# Patient Record
Sex: Female | Born: 1935 | Race: White | Hispanic: No
Health system: Southern US, Community
[De-identification: ages and names within clinical notes are randomized; demographics above are authoritative.]

---

## 2005-12-29 ENCOUNTER — Other Ambulatory Visit: Payer: Self-pay

## 2005-12-29 ENCOUNTER — Inpatient Hospital Stay: Payer: Self-pay

## 2006-03-22 ENCOUNTER — Emergency Department: Payer: Self-pay | Admitting: Unknown Physician Specialty

## 2006-03-22 ENCOUNTER — Other Ambulatory Visit: Payer: Self-pay

## 2006-04-26 ENCOUNTER — Other Ambulatory Visit: Payer: Self-pay

## 2006-04-26 ENCOUNTER — Inpatient Hospital Stay: Payer: Self-pay | Admitting: Internal Medicine

## 2006-05-08 ENCOUNTER — Emergency Department: Payer: Self-pay | Admitting: Emergency Medicine

## 2006-05-08 ENCOUNTER — Other Ambulatory Visit: Payer: Self-pay

## 2006-08-06 IMAGING — CR DG ABDOMEN 3V
1 series · 4 of 4 positions shown · non-contrast
Comparison: none

REASON FOR EXAM: abdominal pain
COMMENTS:

[Series 1: view not recorded · 0.17mm/px · 4 of 4 slices shown]
[im 1/4]
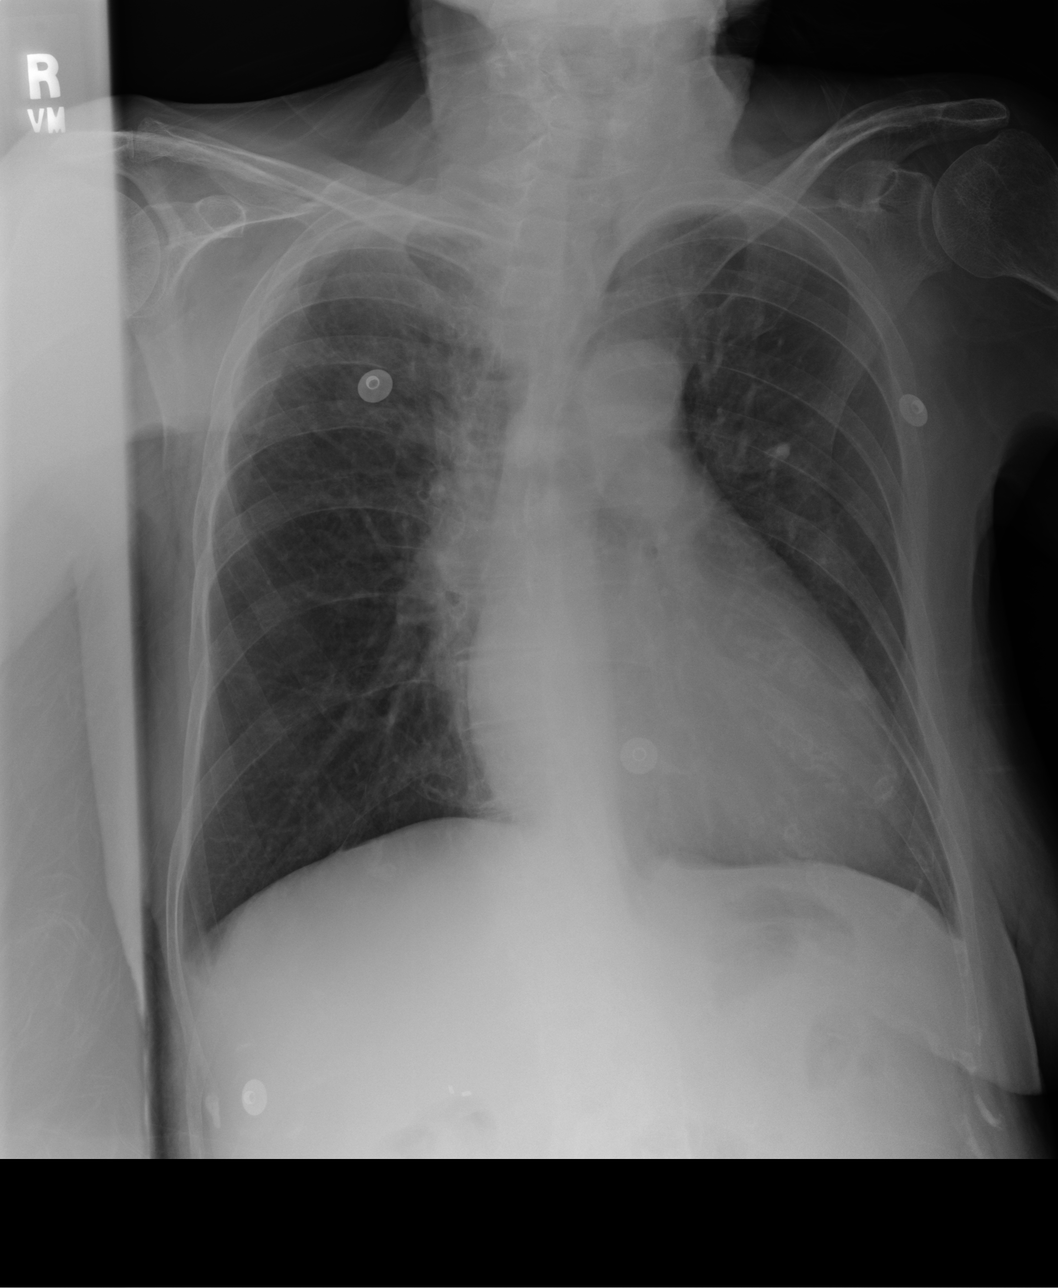
[im 2/4]
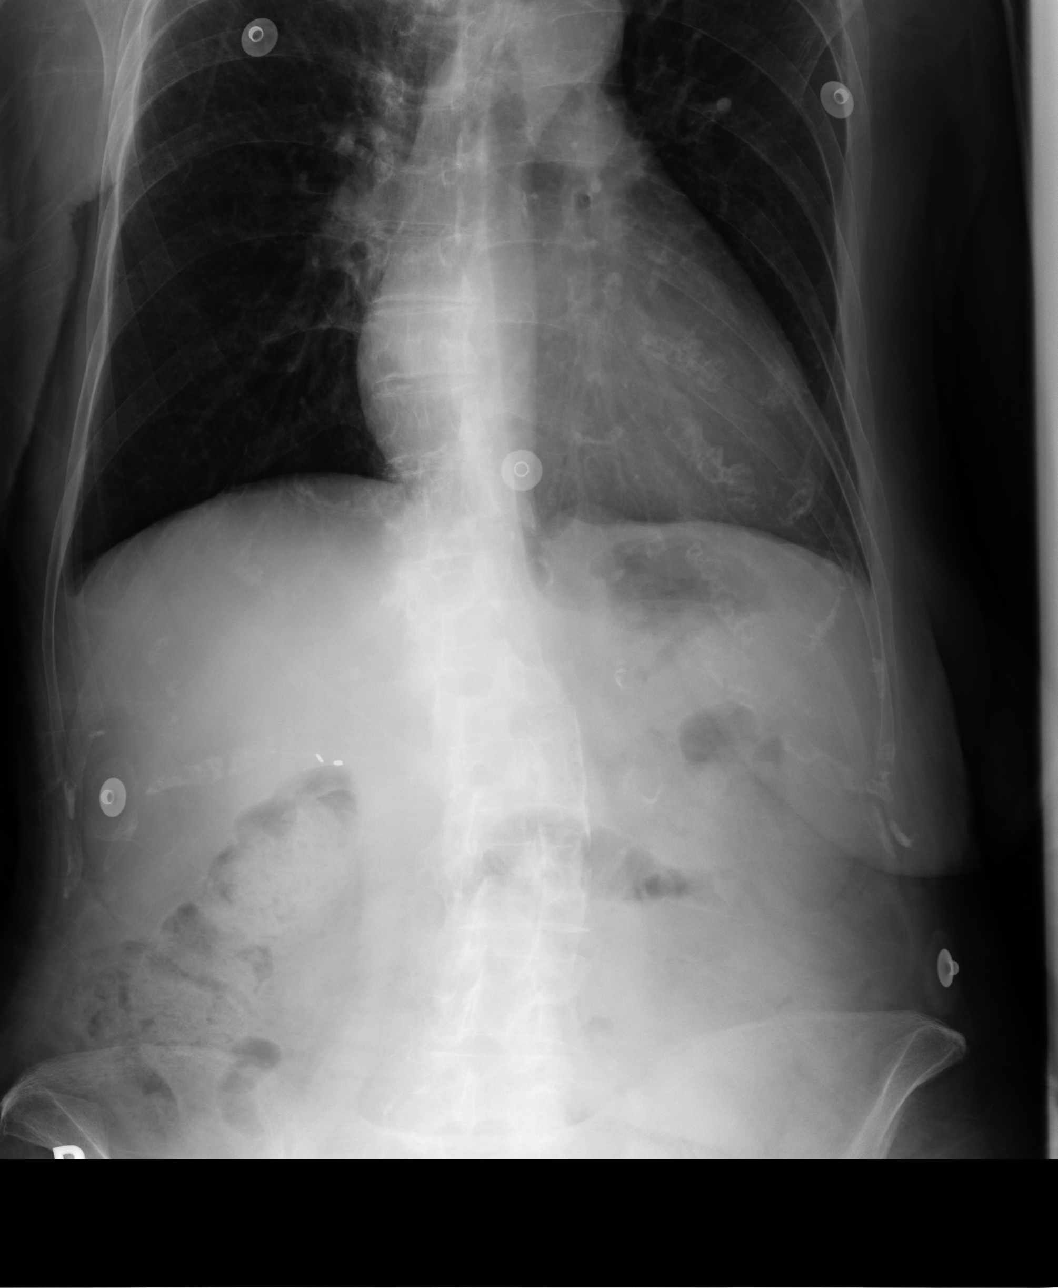
[im 3/4]
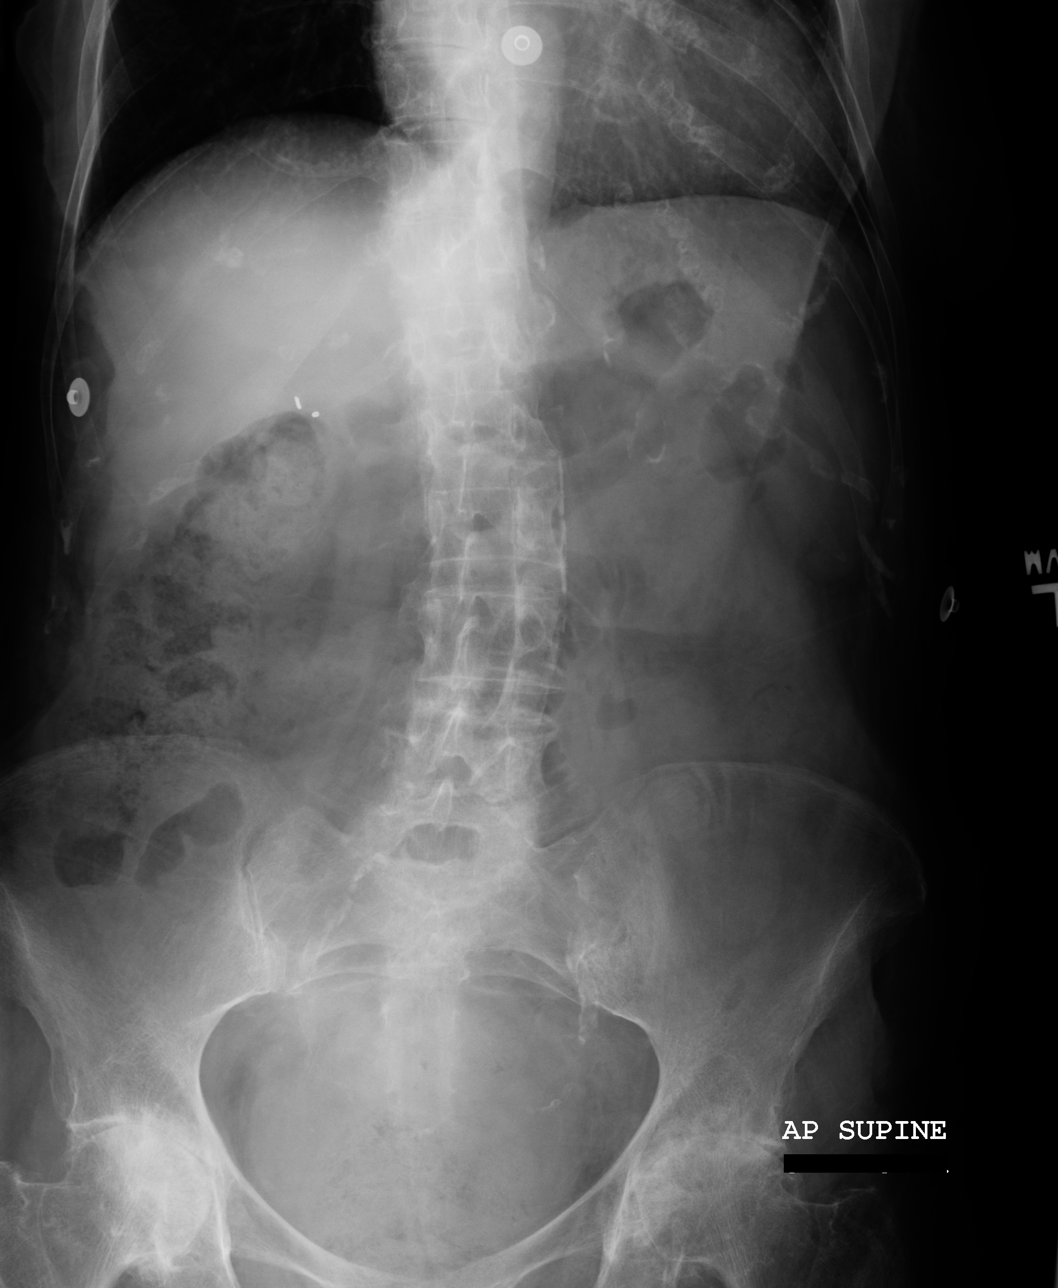
[im 4/4]
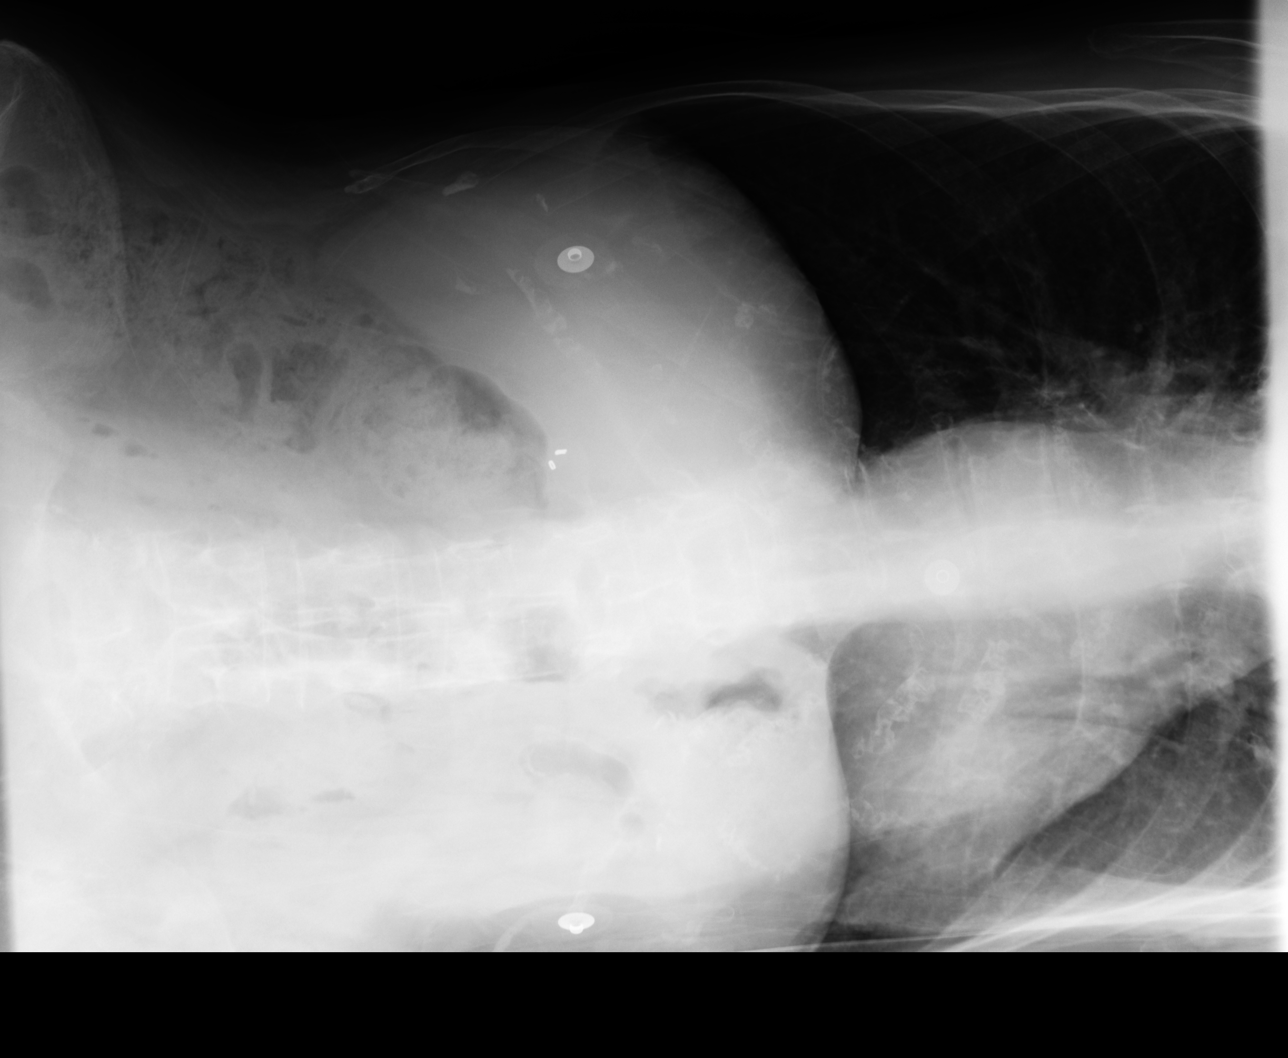

[4 of 4 positions shown; findings below may reference images not displayed]

PROCEDURE:     DXR - DXR ABDOMEN 3-WAY (INCL PA CXR)  - May 09, 2006 [DATE]

RESULT:          Cardiomegaly is present.  There is no evidence of
congestive heart failure on the chest x-ray.  Surgical clips are noted in
the RIGHT upper quadrant.  The gas pattern is nonspecific.  There is an
air-filled loop of small bowel and a normal colonic gas pattern with stool
in the colon.  Aortoiliac atherosclerotic vascular disease is noted.  Severe
degenerative change is noted of both hips.  There is no free air.
IMPRESSION: 1.     Cardiomegaly.  No congestive heart failure.
2.     Aortoiliac atherosclerotic vascular disease and thoracolumbar
degenerative disease as well as bilateral hip degenerative disease.
3.     The exam is otherwise unremarkable.  The gas pattern is nonspecific.
There is no free air.

## 2006-12-23 ENCOUNTER — Ambulatory Visit: Payer: Self-pay | Admitting: Internal Medicine

## 2008-03-21 ENCOUNTER — Other Ambulatory Visit: Payer: Self-pay

## 2008-03-21 ENCOUNTER — Ambulatory Visit: Payer: Self-pay | Admitting: Otolaryngology

## 2008-03-28 ENCOUNTER — Ambulatory Visit: Payer: Self-pay | Admitting: Otolaryngology

## 2009-01-27 ENCOUNTER — Emergency Department: Payer: Self-pay | Admitting: Internal Medicine

## 2009-06-27 ENCOUNTER — Emergency Department: Payer: Self-pay | Admitting: Emergency Medicine

## 2009-10-26 ENCOUNTER — Inpatient Hospital Stay: Payer: Self-pay | Admitting: Internal Medicine

## 2010-01-24 ENCOUNTER — Ambulatory Visit: Payer: Self-pay | Admitting: Family Medicine

## 2012-09-22 ENCOUNTER — Emergency Department: Payer: Self-pay | Admitting: Emergency Medicine

## 2014-05-23 ENCOUNTER — Emergency Department: Payer: Self-pay | Admitting: Emergency Medicine

## 2014-05-23 LAB — URINALYSIS, COMPLETE
BACTERIA: NONE SEEN
Bilirubin,UR: NEGATIVE
Blood: NEGATIVE
Glucose,UR: NEGATIVE mg/dL (ref 0–75)
Leukocyte Esterase: NEGATIVE
NITRITE: NEGATIVE
PH: 5 (ref 4.5–8.0)
Protein: NEGATIVE
SPECIFIC GRAVITY: 1.013 (ref 1.003–1.030)
Squamous Epithelial: NONE SEEN
WBC UR: NONE SEEN /HPF (ref 0–5)

## 2014-05-23 LAB — TROPONIN I: Troponin-I: <0.02 ng/mL

## 2014-05-23 LAB — BASIC METABOLIC PANEL
ANION GAP: 8 (ref 7–16)
BUN: 15 mg/dL (ref 7–18)
CHLORIDE: 97 mmol/L — AB (ref 98–107)
CREATININE: 0.97 mg/dL (ref 0.60–1.30)
Calcium, Total: 8 mg/dL — ABNORMAL LOW (ref 8.5–10.1)
Co2: 29 mmol/L (ref 21–32)
EGFR (African American): 57 — ABNORMAL LOW
EGFR (Non-African Amer.): 49 — ABNORMAL LOW
GLUCOSE: 96 mg/dL (ref 65–99)
Osmolality: 269 (ref 275–301)
POTASSIUM: 4 mmol/L (ref 3.5–5.1)
Sodium: 134 mmol/L — ABNORMAL LOW (ref 136–145)

## 2014-05-23 LAB — CBC
HCT: 35.4 % (ref 35.0–47.0)
HGB: 12.1 g/dL (ref 12.0–16.0)
MCH: 30.2 pg (ref 26.0–34.0)
MCHC: 34.3 g/dL (ref 32.0–36.0)
MCV: 88 fL (ref 80–100)
Platelet: 205 10*3/uL (ref 150–440)
RBC: 4.02 10*6/uL (ref 3.80–5.20)
RDW: 14.1 % (ref 11.5–14.5)
WBC: 7 10*3/uL (ref 3.6–11.0)

## 2014-06-01 ENCOUNTER — Emergency Department: Payer: Self-pay | Admitting: Emergency Medicine

## 2014-08-30 IMAGING — CR DG HIP COMPLETE 2+V*L*
1 series · 3 of 3 positions shown · non-contrast
Comparison: None.

CLINICAL DATA: Left hip pain after fall

EXAM:
LEFT HIP - COMPLETE 2+ VIEW

[Series 1: t pelvis ap · 0.14mm/px · 3 of 3 slices shown]
[im 1/3]
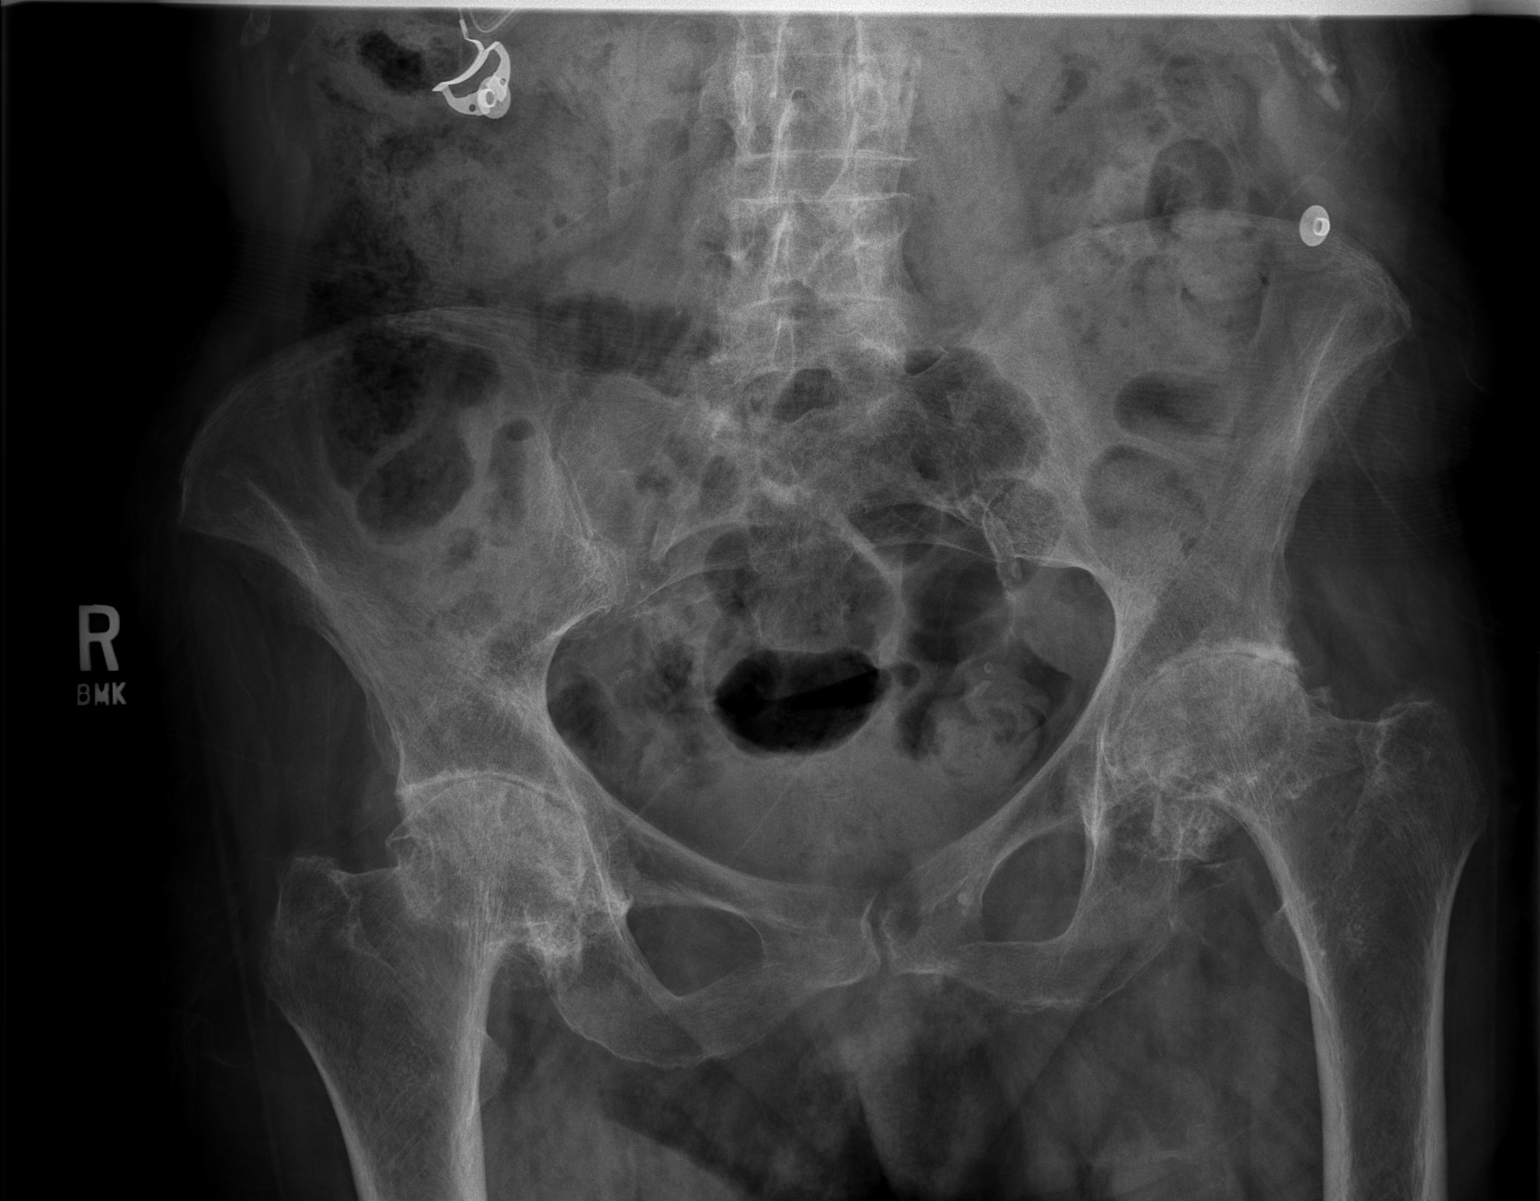
[im 2/3]
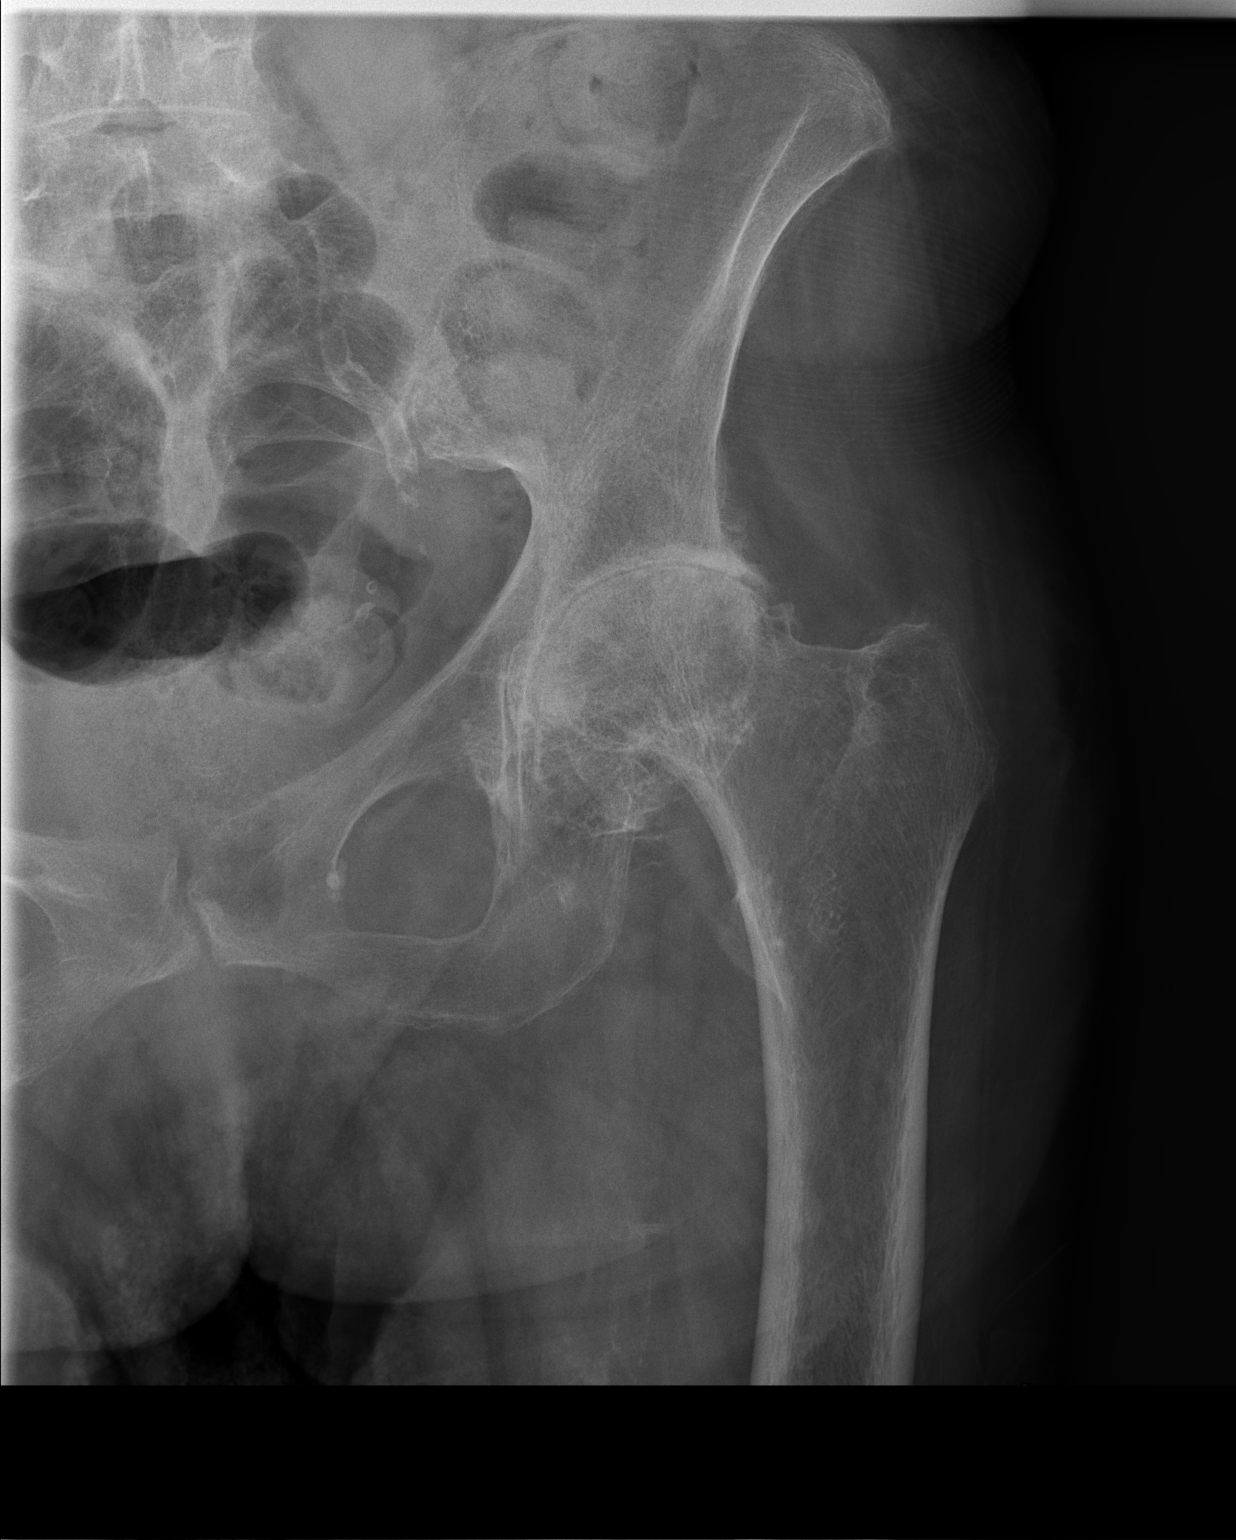
[im 3/3]
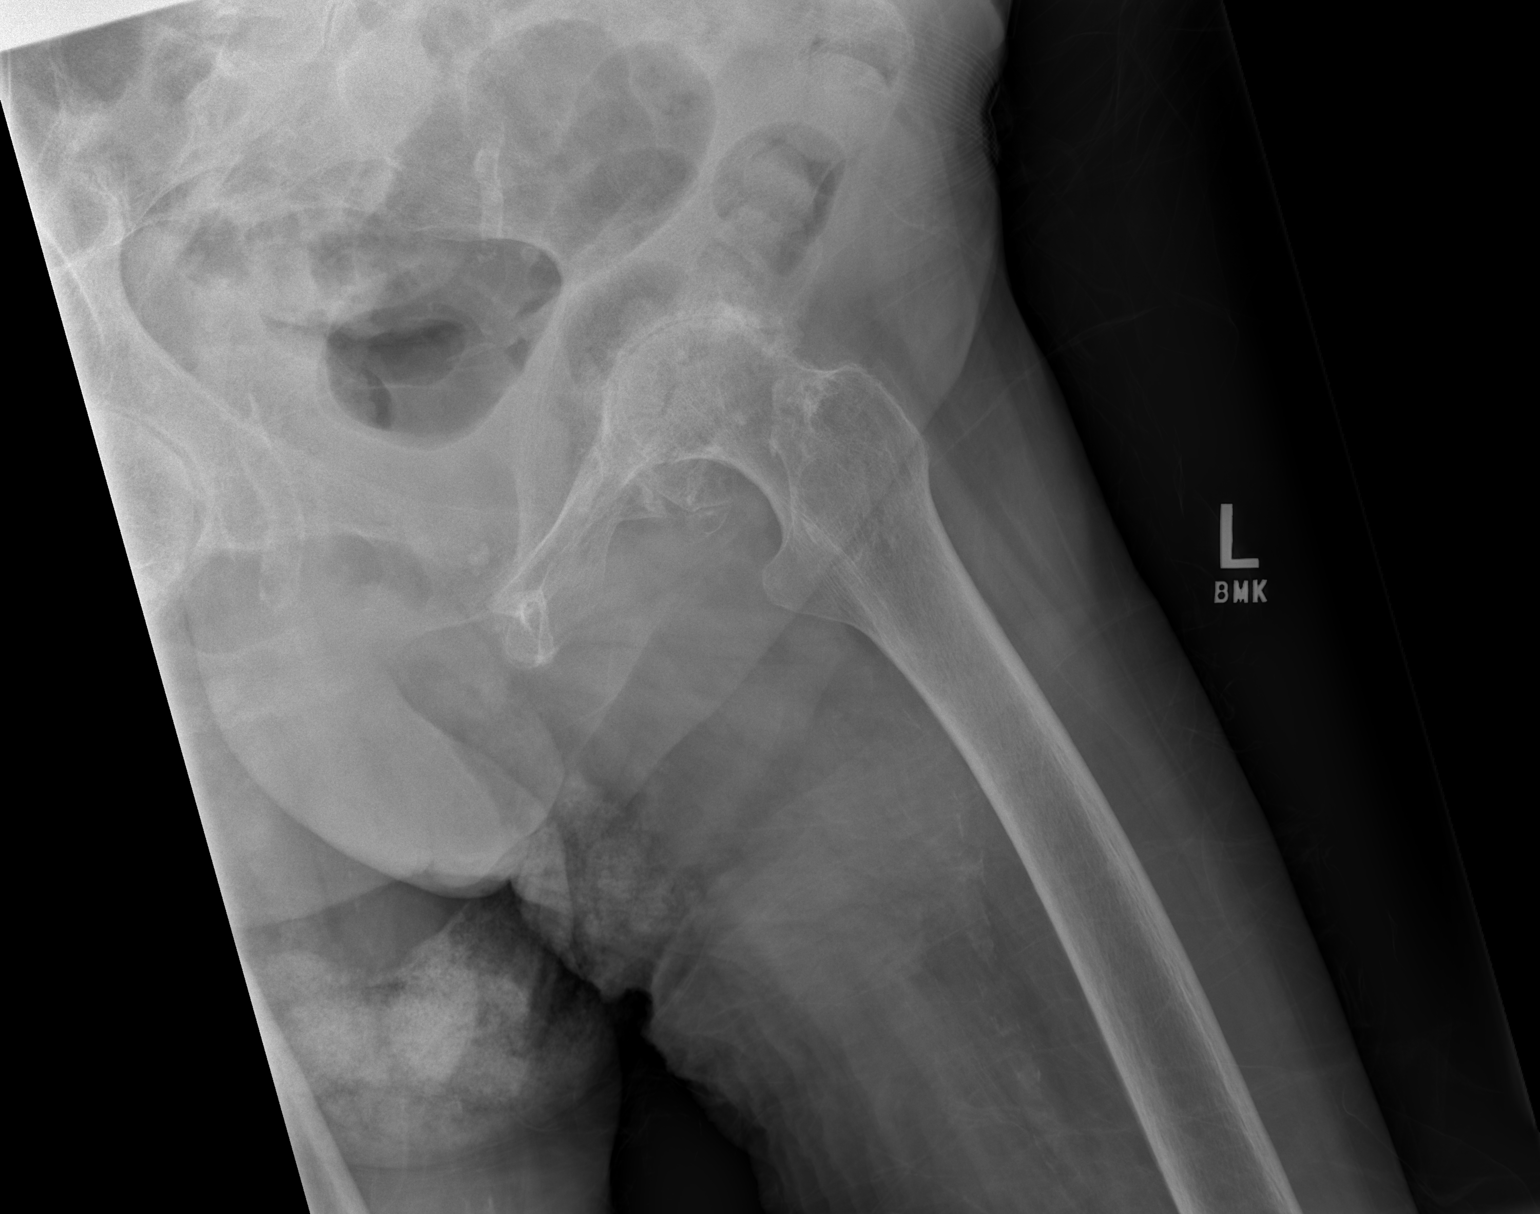

[3 of 3 positions shown; findings below may reference images not displayed]

FINDINGS: Pelvic bones are intact. There is severe narrowing of both hip
joints with flattening of both femoral heads in severe osteophyte
formation. There is no evidence of femur fracture or dislocation.
IMPRESSION: No acute traumatic injury.  Severe bilateral hip arthritis.

## 2015-07-15 ENCOUNTER — Encounter: Payer: Medicare Other | Attending: Surgery | Admitting: Surgery

## 2015-07-15 DIAGNOSIS — L89610 Pressure ulcer of right heel, unstageable: Secondary | ICD-10-CM | POA: Diagnosis present

## 2015-07-15 DIAGNOSIS — Z992 Dependence on renal dialysis: Secondary | ICD-10-CM | POA: Insufficient documentation

## 2015-07-15 DIAGNOSIS — I129 Hypertensive chronic kidney disease with stage 1 through stage 4 chronic kidney disease, or unspecified chronic kidney disease: Secondary | ICD-10-CM | POA: Insufficient documentation

## 2015-07-15 DIAGNOSIS — N183 Chronic kidney disease, stage 3 (moderate): Secondary | ICD-10-CM | POA: Insufficient documentation

## 2015-07-15 DIAGNOSIS — L89620 Pressure ulcer of left heel, unstageable: Secondary | ICD-10-CM | POA: Diagnosis not present

## 2015-07-15 DIAGNOSIS — I4891 Unspecified atrial fibrillation: Secondary | ICD-10-CM | POA: Diagnosis not present

## 2015-07-15 DIAGNOSIS — I70244 Atherosclerosis of native arteries of left leg with ulceration of heel and midfoot: Secondary | ICD-10-CM | POA: Insufficient documentation

## 2015-07-15 DIAGNOSIS — H5441 Blindness, right eye, normal vision left eye: Secondary | ICD-10-CM | POA: Diagnosis not present

## 2015-07-15 DIAGNOSIS — I70234 Atherosclerosis of native arteries of right leg with ulceration of heel and midfoot: Secondary | ICD-10-CM | POA: Diagnosis not present

## 2015-07-15 DIAGNOSIS — F0390 Unspecified dementia without behavioral disturbance: Secondary | ICD-10-CM | POA: Insufficient documentation

## 2015-07-15 DIAGNOSIS — I509 Heart failure, unspecified: Secondary | ICD-10-CM | POA: Insufficient documentation

## 2015-07-15 DIAGNOSIS — I2510 Atherosclerotic heart disease of native coronary artery without angina pectoris: Secondary | ICD-10-CM | POA: Insufficient documentation

## 2015-07-22 ENCOUNTER — Ambulatory Visit: Payer: Medicare Other | Admitting: Surgery

## 2015-07-29 ENCOUNTER — Encounter: Payer: Medicare Other | Attending: Surgery | Admitting: Surgery

## 2015-07-29 DIAGNOSIS — I70234 Atherosclerosis of native arteries of right leg with ulceration of heel and midfoot: Secondary | ICD-10-CM | POA: Diagnosis not present

## 2015-07-29 DIAGNOSIS — I70244 Atherosclerosis of native arteries of left leg with ulceration of heel and midfoot: Secondary | ICD-10-CM | POA: Diagnosis not present

## 2015-07-29 DIAGNOSIS — L89620 Pressure ulcer of left heel, unstageable: Secondary | ICD-10-CM | POA: Diagnosis not present

## 2015-07-29 DIAGNOSIS — F0390 Unspecified dementia without behavioral disturbance: Secondary | ICD-10-CM | POA: Diagnosis not present

## 2015-07-29 DIAGNOSIS — L89610 Pressure ulcer of right heel, unstageable: Secondary | ICD-10-CM | POA: Insufficient documentation

## 2015-07-30 NOTE — Progress Notes (Signed)
[name], [name] ([id number]) Visit Report for 07/15/2015 Abuse/Suicide Risk Screen Details Patient Name: [name], [name] 07/15/2015 2:30 Date of Service: PM Medical Record [id number] Number: Patient Account Number: [id number] Jun 26, 1915 (81 y.o. Treating RN: Renee Harder Date of Birth/Sex: Female) Other Clinician: Primary Care Physician: Audree Bane Treating Evlyn Kanner Referring Physician: Audree Bane Physician/Extender: Tania Ade in Treatment: 0 Abuse/Suicide Risk Screen Items Answer ABUSE/SUICIDE RISK SCREEN: Patient displays signs or symptoms of abuse and/or neglect. No Notes Pt has dementia and is unable answer questions Electronic Signature(s) Signed: 07/29/2015 4:32:11 PM By: Renee Harder RN Entered By: Renee Harder on 07/15/2015 15:08:55 [name], [name] ([id number]) -------------------------------------------------------------------------------- Activities of Daily Living Details Patient Name: [name], [name] 07/15/2015 2:30 Date of Service: PM Medical Record [id number] Number: Patient Account Number: [id number] 1915/05/07 (81 y.o. Treating RN: Renee Harder Date of Birth/Sex: Female) Other Clinician: Primary Care Physician: Audree Bane Treating Evlyn Kanner Referring Physician: Audree Bane Physician/Extender: Tania Ade in Treatment: 0 Activities of Daily Living Items Answer Activities of Daily Living (Please select one for each item) Drive Automobile Not Able Take Medications Need Assistance Use Telephone Not Able Care for Appearance Not Able Use Toilet Need Assistance Bath / Shower Not Able Dress Self Not Able Feed Self Not Able Walk Need Assistance Get In / Out Bed Not Able Housework Not Able Prepare Meals Not Able Handle Money Not Able Shop for Self Not Able Electronic Signature(s) Signed: 07/29/2015 4:32:11 PM By: Renee Harder RN Entered By: Renee Harder on 07/15/2015 15:09:48 [name]  ([id number]) -------------------------------------------------------------------------------- Education Assessment Details Patient Name: [name] 07/15/2015 2:30 Date of Service: PM Medical Record [id number] Number: Patient Account Number: [id number] January 29, 1915 (81 y.o. Treating RN: Renee Harder Date of Birth/Sex: Female) Other Clinician: Primary Care Physician: Audree Bane Treating Evlyn Kanner Referring Physician: Audree Bane Physician/Extender: Tania Ade in Treatment: 0 Primary Learner Assessed: Caregiver friend Reason Patient is not Primary Learner: dementia Learning Preferences/Education Level/Primary Language Learning Preference: Explanation, Demonstration, Printed Material Highest Education Level: College or Above Preferred Language: English Cognitive Barrier Assessment/Beliefs Language Barrier: No Translator Needed: No Memory Deficit: No Emotional Barrier: No Cultural/Religious Beliefs Affecting Medical No Care: Physical Barrier Assessment Impaired Vision: No Impaired Hearing: No Decreased Hand dexterity: No Knowledge/Comprehension Assessment Knowledge Level: Medium Comprehension Level: Medium Ability to understand written Medium instructions: Ability to understand verbal Medium instructions: Motivation Assessment Anxiety Level: Calm Cooperation: Cooperative Education Importance: Acknowledges Need Interest in Health Problems: Asks Questions Perception: Coherent Willingness to Engage in Self- Medium Management Activities: Medium [name] ([id number]) Readiness to Engage in Self- Management Activities: Electronic Signature(s) Signed: 07/29/2015 4:32:11 PM By: Renee Harder RN Entered By: Renee Harder on 07/15/2015 15:10:37 [name], [name] ([id number]) -------------------------------------------------------------------------------- Fall Risk Assessment Details Patient Name: [name] 07/15/2015 2:30 Date of  Service: PM Medical Record [id number] Number: Patient Account Number: [id number] [birth year] (81 y.o. Treating RN: Renee Harder Date of Birth/Sex: Female) Other Clinician: Primary Care Physician: Audree Bane Treating Britto, Errol Referring Physician: Audree Bane Physician/Extender: Tania Ade in Treatment: 0 Fall Risk Assessment Items FALL RISK ASSESSMENT: History of falling - immediate or within 3 months 25 Yes Secondary diagnosis 0 No Ambulatory aid None/bed rest/wheelchair/nurse 0 Yes Crutches/cane/walker 0 No Furniture 0 No IV Access/Saline Lock 0 No Gait/Training Normal/bed rest/immobile 0 Yes Weak 0 No Impaired 0 No Mental Status Oriented to own ability 0 No Electronic Signature(s) Signed: 07/29/2015 4:32:11 PM By: Renee Harder RN Entered By: Renee Harder on 07/15/2015 15:10:59 [name], [name] ([id number]) -------------------------------------------------------------------------------- Foot Assessment Details Patient Name:  [name], [name] 07/15/2015 2:30 Date of Service: PM Medical Record [id number] Number: Patient Account Number: [id number] December 26, 1914 (81 y.o. Treating RN: Renee Harder Date of Birth/Sex: Female) Other Clinician: Primary Care Physician: Audree Bane Treating Britto, Errol Referring Physician: Audree Bane Physician/Extender: Tania Ade in Treatment: 0 Foot Assessment Items  Unable to perform due to altered mental status Site Locations + = Sensation present, - = Sensation absent, C = Callus, U = Ulcer R = Redness, W = Warmth, M = Maceration, PU = Pre-ulcerative lesion F = Fissure, S = Swelling, D = Dryness Assessment Right: Left: Other Deformity: No No Prior Foot Ulcer: No No Prior Amputation: No No Charcot Joint: No No Ambulatory Status: Gait: Electronic Signature(s) Signed: 07/29/2015 4:32:11 PM By: Renee Harder RN Entered By: Renee Harder on 07/15/2015 15:12:04 [name], [name] ([id number]) [name]  ([id number]) -------------------------------------------------------------------------------- Nutrition Risk Assessment Details Patient Name: [name] 07/15/2015 2:30 Date of Service: PM Medical Record [id number] Number: Patient Account Number: [id number] June 12, 1915 (81 y.o. Treating RN: Renee Harder Date of Birth/Sex: Female) Other Clinician: Primary Care Physician: Audree Bane Treating Evlyn Kanner Referring Physician: Audree Bane Physician/Extender: Tania Ade in Treatment: 0 Height (in): Weight (lbs): Body Mass Index (BMI): Nutrition Risk Assessment Items NUTRITION RISK SCREEN: I have an illness or condition that made me change the kind and/or 2 Yes amount of food I eat I eat fewer than two meals per day 0 No I eat few fruits and vegetables, or milk products 0 No I have three or more drinks of beer, liquor or wine almost every day 0 No I have tooth or mouth problems that make it hard for me to eat 2 Yes I don't always have enough money to buy the food I need 0 No I eat alone most of the time 0 No I take three or more different prescribed or over-the-counter drugs a 1 Yes day Without wanting to, I have lost or gained 10 pounds in the last six 0 No months I am not always physically able to shop, cook and/or feed myself 0 No Nutrition Protocols Good Risk Protocol Provide education on Moderate Risk Protocol 0 nutrition Electronic Signature(s) Signed: 07/29/2015 4:32:11 PM By: Renee Harder RN Entered By: Renee Harder on 07/15/2015 15:11:55

## 2015-07-30 NOTE — Progress Notes (Signed)
AIDE, WOJNAR ([id number]) Visit Report for 07/15/2015 Allergy List Details Patient Name: [name], [name] Date of Service: 07/15/2015 2:30 PM Medical Record Number: [id number] Patient Account Number: [id number] Date of Birth/Sex: 1915-08-29 (81 y.o. Female) Treating RN: Huel Coventry Primary Care Physician: Audree Bane Other Clinician: Referring Physician: Audree Bane Treating Physician/Extender: Rudene Re in Treatment: 0 Allergies Active Allergies Augmentin Allergy Notes Electronic Signature(s) Signed: 07/15/2015 4:49:24 PM By: Elliot Gurney RN, BSN, Kim RN, BSN Entered By: Elliot Gurney, RN, BSN, Kim on 07/15/2015 15:30:02 [name] ([id number]) -------------------------------------------------------------------------------- Arrival Information Details Patient Name: [name] Date of Service: 07/15/2015 2:30 PM Medical Record Number: [id number] Patient Account Number: [id number] Date of Birth/Sex: 1915/05/22 (81 y.o. Female) Treating RN: Renee Harder Primary Care Physician: Audree Bane Other Clinician: Referring Physician: Audree Bane Treating Physician/Extender: Rudene Re in Treatment: 0 Visit Information Patient Arrived: Ambulatory Arrival Time: 14:39 Accompanied By: friend Transfer Assistance: None Patient Identification Verified: Yes Secondary Verification Process Yes Completed: Patient Has Alerts: Yes Patient Alerts: Patient on Blood Thinner ASA 81 mg NO BP (R) arm Electronic Signature(s) Signed: 07/29/2015 4:32:11 PM By: Renee Harder RN Entered By: Renee Harder on 07/15/2015 15:21:53 Case, [name] ([id number]) -------------------------------------------------------------------------------- Clinic Level of Care Assessment Details Patient Name: [name] Date of Service: 07/15/2015 2:30 PM Medical Record Number: [id number] Patient Account Number: [id number] Date of Birth/Sex: [birth year] (81 y.o. Female) Treating RN: Huel Coventry Primary Care Physician: Audree Bane Other Clinician: Referring Physician: Audree Bane Treating Physician/Extender: Rudene Re in Treatment: 0 Clinic Level of Care Assessment Items TOOL 1 Quantity Score  - Use when EandM and Procedure is performed on INITIAL visit 0 ASSESSMENTS - Nursing Assessment / Reassessment X - General Physical Exam (combine w/ comprehensive assessment (listed just 1 20 below) when performed on new pt. evals) X - Comprehensive Assessment (HX, ROS, Risk Assessments, Wounds Hx, etc.) 1 25 ASSESSMENTS - Wound and Skin Assessment / Reassessment  - Dermatologic / Skin Assessment (not related to wound area) 0 ASSESSMENTS - Ostomy and/or Continence Assessment and Care  - Incontinence Assessment and Management 0  - Ostomy Care Assessment and Management (repouching, etc.) 0 PROCESS - Coordination of Care X - Simple Patient / Family Education for ongoing care 1 15  - Complex (extensive) Patient / Family Education for ongoing care 0 X - Staff obtains Chiropractor, Records, Test Results / Process Orders 1 10  - Staff telephones HHA, Nursing Homes / Clarify orders / etc 0  - Routine Transfer to another Facility (non-emergent condition) 0  - Routine Hospital Admission (non-emergent condition) 0 X - New Admissions / Manufacturing engineer / Ordering NPWT, Apligraf, etc. 1 15  - Emergency Hospital Admission (emergent condition) 0 PROCESS - Special Needs  - Pediatric / Minor Patient Management 0  - Isolation Patient Management 0 [name], [name] ([id number])  - Hearing / Language / Visual special needs 0  - Assessment of Community assistance (transportation, D/C planning, etc.) 0  - Additional assistance / Altered mentation 0  - Support Surface(s) Assessment (bed, cushion, seat, etc.) 0 INTERVENTIONS - Miscellaneous  - External ear exam 0  - Patient Transfer (multiple staff / Nurse, adult / Similar devices) 0  - Simple Staple /  Suture removal (25 or less) 0  - Complex Staple / Suture removal (26 or more) 0  - Hypo/Hyperglycemic Management (do not check if billed separately) 0 X - Ankle / Brachial Index (ABI) - do not check if billed separately 1 15 Has the  patient been seen at the hospital within the last three years: Yes Total Score: 100 Level Of Care: New/Established - Level 3 Electronic Signature(s) Signed: 07/15/2015 4:49:24 PM By: Elliot Gurney, RN, BSN, Kim RN, BSN Entered By: Elliot Gurney, RN, BSN, Kim on 07/15/2015 15:41:00 [name] ([id number]) -------------------------------------------------------------------------------- Encounter Discharge Information Details Patient Name: [name] Date of Service: 07/15/2015 2:30 PM Medical Record Number: [id number] Patient Account Number: [id number] Date of Birth/Sex: [birth year] (81 y.o. Female) Treating RN: Huel Coventry Primary Care Physician: Audree Bane Other Clinician: Referring Physician: Audree Bane Treating Physician/Extender: Rudene Re in Treatment: 0 Encounter Discharge Information Items Facility Notification Discharge Condition: Stable Facility Type: Long Term Care Facility Ambulatory Status: Wheelchair Orders Sent: Yes Discharge Destination: Nursing Home Transportation: Private Auto Accompanied By: friend Schedule Follow-up Appointment: Yes Medication Reconciliation completed and provided to Patient/Care No Provider: Provided on Clinical Summary of Care: 07/15/2015 Form Type Recipient Paper Patient dr Electronic Signature(s) Signed: 07/29/2015 4:32:11 PM By: Renee Harder RN Previous Signature: 07/15/2015 3:53:11 PM Version By: Francie Massing Entered By: Renee Harder on 07/15/2015 16:21:40 [name], [name] ([id number]) -------------------------------------------------------------------------------- Lower Extremity Assessment Details Patient Name: [name] Date of Service: 07/15/2015 2:30 PM Medical Record Number:  [id number] Patient Account Number: [id number] Date of Birth/Sex: [birth year] (81 y.o. Female) Treating RN: Renee Harder Primary Care Physician: Audree Bane Other Clinician: Referring Physician: Audree Bane Treating Physician/Extender: Rudene Re in Treatment: 0 Edema Assessment Assessed: [Left: Yes] [Right: Yes] Edema: [Left: Yes] [Right: Yes] Calf Left: Right: Point of Measurement: 28 cm From Medial Instep 27.5 cm 28.5 cm Ankle Left: Right: Point of Measurement: 9 cm From Medial Instep 18.6 cm 20 cm Vascular Assessment Pulses: Posterior Tibial Dorsalis Pedis Palpable: [Left:Yes] [Right:Yes] Extremity colors, hair growth, and conditions: Extremity Color: [Left:Normal] [Right:Normal] Hair Growth on Extremity: [Left:No] [Right:No] Temperature of Extremity: [Left:Warm] [Right:Warm] Capillary Refill: [Left:< 3 seconds] [Right:< 3 seconds] Blood Pressure: Brachial: [Left:168] Dorsalis Pedis: 82 [Left:Dorsalis Pedis: 84] Ankle: Posterior Tibial: 90 [Left:Posterior Tibial: 72 0.54] [Right:0.50] Toe Nail Assessment Left: Right: Thick: Yes Yes Discolored: Yes Yes Deformed: Yes Yes Improper Length and Hygiene: Yes Yes Electronic Signature(s) Signed: 07/29/2015 4:32:11 PM By: Renee Harder RN [name], Kathy Kathie Rhodes ([id number]) Entered By: Renee Harder on 07/15/2015 15:21:20 Manukyan, [name] ([id number]) -------------------------------------------------------------------------------- Multi Wound Chart Details Patient Name: [name] Date of Service: 07/15/2015 2:30 PM Medical Record Number: [id number] Patient Account Number: [id number] Date of Birth/Sex: [birth year] (81 y.o. Female) Treating RN: Huel Coventry Primary Care Physician: Audree Bane Other Clinician: Referring Physician: Audree Bane Treating Physician/Extender: Rudene Re in Treatment: 0 Vital Signs Height(in): Pulse(bpm): 93 Weight(lbs): Blood Pressure 185/87 (mmHg): Body Mass  Index(BMI): Temperature(F): 98.5 Respiratory Rate 24 (breaths/min): Photos: [1:No Photos] [2:No Photos] [N/A:N/A] Wound Location: [1:Right Calcaneous] [2:Left Calcaneous] [N/A:N/A] Wounding Event: [1:Pressure Injury] [2:Pressure Injury] [N/A:N/A] Primary Etiology: [1:Pressure Ulcer] [2:Pressure Ulcer] [N/A:N/A] Comorbid History: [1:Anemia, Arrhythmia, Congestive Heart Failure, Coronary Artery Disease, Deep Vein Thrombosis, Hypertension, End Stage Renal Disease, Osteoarthritis, Dementia] [2:Anemia, Arrhythmia, Congestive Heart Failure, Coronary Artery Disease,  Deep Vein Thrombosis, Hypertension, End Stage Renal Disease, Osteoarthritis, Dementia] [N/A:N/A] Date Acquired: [1:12/25/2014] [2:02/24/2015] [N/A:N/A] Weeks of Treatment: [1:0] [2:0] [N/A:N/A] Wound Status: [1:Open] [2:Open] [N/A:N/A] Measurements L x W x D 3x4.2x0.1 [2:3.6x4.2x0.3] [N/A:N/A] (cm) Area (cm) : [1:9.896] [2:11.875] [N/A:N/A] Volume (cm) : [1:0.99] [2:3.563] [N/A:N/A] % Reduction in Area: [1:0.00%] [2:0.00%] [N/A:N/A] % Reduction in Volume: 0.00% [2:0.00%] [N/A:N/A] Classification: [1:Unstageable/Unclassified] [2:Unstageable/Unclassified] [N/A:N/A] Exudate Amount: [1:Medium] [2:Small] [N/A:N/A] Exudate Type: [1:Serosanguineous] [2:Serosanguineous] [N/A:N/A] Exudate Color: [1:red, brown] [  2:red, brown] [N/A:N/A] Wound Margin: [1:Indistinct, nonvisible] [2:Indistinct, nonvisible] [N/A:N/A] Granulation Amount: [1:None Present (0%)] [2:None Present (0%)] [N/A:N/A] Necrotic Amount: [1:None Present (0%)] [2:Medium (34-66%)] [N/A:N/A] Necrotic Tissue: [1:N/A] [2:Eschar] [N/A:N/A] Exposed Structures: [1:Fascia: No Fat: No] [2:Fascia: No Fat: No] [N/A:N/A] Tendon: No Tendon: No Muscle: No Muscle: No Joint: No Joint: No Bone: No Bone: No Limited to Skin Limited to Skin Breakdown Breakdown Epithelialization: Large (67-100%) Medium (34-66%) N/A Periwound Skin Texture: Edema: No Edema: No N/A Excoriation:  No Excoriation: No Induration: No Induration: No Callus: No Callus: No Crepitus: No Crepitus: No Fluctuance: No Fluctuance: No Friable: No Friable: No Rash: No Rash: No Scarring: No Scarring: No Periwound Skin Maceration: No Maceration: No N/A Moisture: Moist: No Moist: No Dry/Scaly: No Dry/Scaly: No Periwound Skin Color: Ecchymosis: Yes Ecchymosis: Yes N/A Atrophie Blanche: No Atrophie Blanche: No Cyanosis: No Cyanosis: No Erythema: No Erythema: No Hemosiderin Staining: No Hemosiderin Staining: No Mottled: No Mottled: No Pallor: No Pallor: No Rubor: No Rubor: No Temperature: No Abnormality No Abnormality N/A Tenderness on No No N/A Palpation: Wound Preparation: Ulcer Cleansing: Ulcer Cleansing: N/A Rinsed/Irrigated with Rinsed/Irrigated with Saline Saline Topical Anesthetic Topical Anesthetic Applied: Other: Lidocaine Applied: Other: Lidocaine 4% Ointment 4% Ointment Treatment Notes Electronic Signature(s) Signed: 07/15/2015 4:49:24 PM By: Elliot Gurney, RN, BSN, Kim RN, BSN Entered By: Elliot Gurney, RN, BSN, Kim on 07/15/2015 15:31:13 [name], [name] ([id number]) -------------------------------------------------------------------------------- Multi-Disciplinary Care Plan Details Patient Name: [name] Date of Service: 07/15/2015 2:30 PM Medical Record Number: [id number] Patient Account Number: [id number] Date of Birth/Sex: [birth year] (81 y.o. Female) Treating RN: Huel Coventry Primary Care Physician: Audree Bane Other Clinician: Referring Physician: Audree Bane Treating Physician/Extender: Rudene Re in Treatment: 0 Active Inactive Abuse / Safety / Falls / Self Care Management Nursing Diagnoses: Potential for falls Goals: Patient will remain injury free Date Initiated: 07/15/2015 Goal Status: Active Interventions: Assess fall risk on admission and as needed Notes: Nutrition Nursing Diagnoses: Potential for alteratiion in  Nutrition/Potential for imbalanced nutrition Goals: Patient/caregiver agrees to and verbalizes understanding of need to use nutritional supplements and/or vitamins as prescribed Date Initiated: 07/15/2015 Goal Status: Active Interventions: Assess patient nutrition upon admission and as needed per policy Notes: Orientation to the Wound Care Program Nursing Diagnoses: Knowledge deficit related to the wound healing center program Goals: Patient/caregiver will verbalize understanding of the [facility]aling Center [address] [name], [name] ([id number]) Date Initiated: 07/15/2015 Goal Status: Active Interventions: Provide education on orientation to the wound center Notes: Pressure Nursing Diagnoses: Knowledge deficit related to management of pressures ulcers Goals: Patient will remain free from development of additional pressure ulcers Date Initiated: 07/15/2015 Goal Status: Active Interventions: Assess: immobility, friction, shearing, incontinence upon admission and as needed Provide education on pressure ulcers Notes: Wound/Skin Impairment Nursing Diagnoses: Impaired tissue integrity Goals: Patient/caregiver will verbalize understanding of skin care regimen Date Initiated: 07/15/2015 Goal Status: Active Interventions: Assess patient/caregiver ability to obtain necessary supplies Treatment Activities: Topical wound management initiated : 07/15/2015 Notes: Electronic Signature(s) Signed: 07/15/2015 4:49:24 PM By: Elliot Gurney, RN, BSN, Kim RN, BSN Entered By: Elliot Gurney, RN, BSN, Kim on 07/15/2015 15:30:33 [name], [name] ([id number]) -------------------------------------------------------------------------------- Pain Assessment Details Patient Name: [name] Date of Service: 07/15/2015 2:30 PM Medical Record Number: [id number] Patient Account Number: [id number] Date of Birth/Sex: [birth year] (81 y.o. Female) Treating RN: Renee Harder Primary Care Physician: Audree Bane Other  Clinician: Referring Physician: Audree Bane Treating Physician/Extender: Rudene Re in Treatment: 0 Active Problems Location of Pain Severity and Description of Pain Patient Has  Paino Patient Unable to Respond Site Locations Pain Management and Medication Current Pain Management: Electronic Signature(s) Signed: 07/15/2015 4:49:24 PM By: Elliot Gurney RN, BSN, Kim RN, BSN Signed: 07/29/2015 4:32:11 PM By: Renee Harder RN Entered By: Elliot Gurney RN, BSN, Kim on 07/15/2015 15:28:49 [name] ([id number]) -------------------------------------------------------------------------------- Patient/Caregiver Education Details Patient Name: [name] Date of Service: 07/15/2015 2:30 PM Medical Record Number: [id number] Patient Account Number: [id number] Date of Birth/Gender: Mar 13, 1915 (81 y.o. Female) Treating RN: Renee Harder Primary Care Physician: Audree Bane Other Clinician: Referring Physician: Audree Bane Treating Physician/Extender: Rudene Re in Treatment: 0 Education Assessment Education Provided To: Caregiver friend Education Topics Provided Nutrition: Methods: Explain/Verbal Responses: State content correctly Offloading: Methods: Explain/Verbal Responses: State content correctly Pressure: Methods: Explain/Verbal Responses: State content correctly Safety: Methods: Explain/Verbal Welcome To The Wound Care Center: Methods: Explain/Verbal Responses: State content correctly Wound Debridement: Methods: Explain/Verbal Responses: State content correctly Wound/Skin Impairment: Methods: Explain/Verbal Responses: State content correctly Electronic Signature(s) Signed: 07/29/2015 4:32:11 PM By: Renee Harder RN Entered By: Renee Harder on 07/15/2015 16:22:14 Reppert, [name] ([id number]) -------------------------------------------------------------------------------- Wound Assessment Details Patient Name: [name] Date of Service:  07/15/2015 2:30 PM Medical Record Number: [id number] Patient Account Number: [id number] Date of Birth/Sex: [birth year] (81 y.o. Female) Treating RN: Renee Harder Primary Care Physician: Audree Bane Other Clinician: Referring Physician: Audree Bane Treating Physician/Extender: Rudene Re in Treatment: 0 Wound Status Wound Number: 1 Primary Pressure Ulcer Etiology: Wound Location: Right Calcaneous Wound Open Wounding Event: Pressure Injury Status: Date Acquired: 12/25/2014 Comorbid Anemia, Arrhythmia, Congestive Heart Weeks Of Treatment: 0 History: Failure, Coronary Artery Disease, Deep Clustered Wound: No Vein Thrombosis, Hypertension, End Stage Renal Disease, Osteoarthritis, Dementia Photos Photo Uploaded By: Elliot Gurney, RN, BSN, Kim on 07/15/2015 62:95:28 Wound Measurements Length: (cm) 3 % Reduction in Width: (cm) 4.2 % Reduction in Depth: (cm) 0.1 Epithelializati Area: (cm) 9.896 Tunneling: Volume: (cm) 0.99 Undermining: Area: 0% Volume: 0% on: Large (67-100%) No No Wound Description Classification: Category/Stage III Wound Margin: Indistinct, nonvisible [name], [name] ([id number]) Foul Odor After Cleansing: No Exudate Amount: Medium Exudate Type: Serosanguineous Exudate Color: red, brown Wound Bed Granulation Amount: None Present (0%) Exposed Structure Necrotic Amount: None Present (0%) Fascia Exposed: No Fat Layer Exposed: No Tendon Exposed: No Muscle Exposed: No Joint Exposed: No Bone Exposed: No Limited to Skin Breakdown Periwound Skin Texture Texture Color No Abnormalities Noted: No No Abnormalities Noted: No Callus: No Atrophie Blanche: No Crepitus: No Cyanosis: No Excoriation: No Ecchymosis: Yes Fluctuance: No Erythema: No Friable: No Hemosiderin Staining: No Induration: No Mottled: No Localized Edema: No Pallor: No Rash: No Rubor: No Scarring: No Temperature / Pain Moisture Temperature: No Abnormality No Abnormalities  Noted: No Dry / Scaly: No Maceration: No Moist: No Wound Preparation Ulcer Cleansing: Rinsed/Irrigated with Saline Topical Anesthetic Applied: Other: Lidocaine 4% Ointment, Electronic Signature(s) Signed: 07/15/2015 4:49:24 PM By: Elliot Gurney RN, BSN, Kim RN, BSN Signed: 07/29/2015 4:32:11 PM By: Renee Harder RN Entered By: Elliot Gurney RN, BSN, Kim on 07/15/2015 15:36:07 Hakimian, [name] ([id number]) -------------------------------------------------------------------------------- Wound Assessment Details Patient Name: [name] Date of Service: 07/15/2015 2:30 PM Medical Record Number: [id number] Patient Account Number: [id number] Date of Birth/Sex: 1915-05-06 (81 y.o. Female) Treating RN: Renee Harder Primary Care Physician: Audree Bane Other Clinician: Referring Physician: Audree Bane Treating Physician/Extender: Rudene Re in Treatment: 0 Wound Status Wound Number: 2 Primary Pressure Ulcer Etiology: Wound Location: Left Calcaneous Wound Open Wounding Event: Pressure Injury Status: Date Acquired: 02/24/2015 Comorbid Anemia, Arrhythmia, Congestive Heart Weeks Of  Treatment: 0 History: Failure, Coronary Artery Disease, Deep Clustered Wound: No Vein Thrombosis, Hypertension, End Stage Renal Disease, Osteoarthritis, Dementia Photos Photo Uploaded By: Elliot Gurney, RN, BSN, Kim on 07/15/2015 16:08:25 Wound Measurements Length: (cm) 3.6 % Reduction in Width: (cm) 4.2 % Reduction in Depth: (cm) 0.3 Epithelializati Area: (cm) 11.875 Volume: (cm) 3.563 Area: 0% Volume: 0% on: Medium (34-66%) Wound Description Classification: Unstageable/Unclassified Wound Margin: Indistinct, nonvisible Exudate Amount: Small Exudate Type: Serosanguineous Exudate Color: red, brown Foul Odor After Cleansing: No Wound Bed Granulation Amount: None Present (0%) Exposed Structure Necrotic Amount: Medium (34-66%) Fascia Exposed: No [name], [name] ([id number]) Necrotic Quality:  Eschar Fat Layer Exposed: No Tendon Exposed: No Muscle Exposed: No Joint Exposed: No Bone Exposed: No Limited to Skin Breakdown Periwound Skin Texture Texture Color No Abnormalities Noted: No No Abnormalities Noted: No Callus: No Atrophie Blanche: No Crepitus: No Cyanosis: No Excoriation: No Ecchymosis: Yes Fluctuance: No Erythema: No Friable: No Hemosiderin Staining: No Induration: No Mottled: No Localized Edema: No Pallor: No Rash: No Rubor: No Scarring: No Temperature / Pain Moisture Temperature: No Abnormality No Abnormalities Noted: No Dry / Scaly: No Maceration: No Moist: No Wound Preparation Ulcer Cleansing: Rinsed/Irrigated with Saline Topical Anesthetic Applied: Other: Lidocaine 4% Ointment, Electronic Signature(s) Signed: 07/29/2015 4:32:11 PM By: Renee Harder RN Entered By: Renee Harder on 07/15/2015 15:16:37 [name], [name] ([id number]) -------------------------------------------------------------------------------- Vitals Details Patient Name: [name] Date of Service: 07/15/2015 2:30 PM Medical Record Number: [id number] Patient Account Number: [id number] Date of Birth/Sex: 07/30/1915 (81 y.o. Female) Treating RN: Renee Harder Primary Care Physician: Audree Bane Other Clinician: Referring Physician: Audree Bane Treating Physician/Extender: Rudene Re in Treatment: 0 Vital Signs Time Taken: 14:43 Temperature (F): 98.5 Pulse (bpm): 93 Respiratory Rate (breaths/min): 24 Blood Pressure (mmHg): 185/87 Reference Range: 80 - 120 mg / dl Notes CM notified of elevated BP. MD notified. Electronic Signature(s) Signed: 07/15/2015 4:49:24 PM By: Elliot Gurney, RN, BSN, Kim RN, BSN Entered By: Elliot Gurney, RN, BSN, Kim on 07/15/2015 15:29:30

## 2015-07-30 NOTE — Progress Notes (Signed)
ROYALTY, FAKHOURI ([id number]) Visit Report for 07/15/2015 Chief Complaint Document Details Patient Name: [name], [name] 07/15/2015 2:30 Date of Service: PM Medical Record [id number] Number: Patient Account Number: [id number] 1915-06-26 (81 y.o. Treating RN: Huel Coventry Date of Birth/Sex: Female) Other Clinician: Primary Care Physician: Audree Bane Treating Evlyn Kanner Referring Physician: Audree Bane Physician/Extender: Tania Ade in Treatment: 0 Information Obtained from: Caregiver Chief Complaint Patient is at the clinic for treatment of an open pressure ulcer. The patient has dementia and comes along with her caregiver does not have the power of attorney but is a close friend and will pressure. She says she has had changes of both heels for several months now. Electronic Signature(s) Signed: 07/15/2015 3:50:36 PM By: Evlyn Kanner MD, FACS Entered By: Evlyn Kanner on 07/15/2015 15:50:36 [name] ([id number]) -------------------------------------------------------------------------------- Debridement Details Patient Name: [name] 07/15/2015 2:30 Date of Service: PM Medical Record [id number] Number: Patient Account Number: [id number] 25-Feb-1915 (81 y.o. Treating RN: Huel Coventry Date of Birth/Sex: Female) Other Clinician: Primary Care Physician: Audree Bane Treating Britto, Errol Referring Physician: Audree Bane Physician/Extender: Tania Ade in Treatment: 0 Debridement Performed for Wound #1 Right Calcaneous Assessment: Performed By: Physician Tristan Schroeder., MD Debridement: Open Wound/Selective Debridement Selective Description: Pre-procedure Yes Verification/Time Out Taken: Start Time: 15:30 Pain Control: Other : lidocaine 4% Level: Skin/Dermis Total Area Debrided (L x 3 (cm) x 4.2 (cm) = 12.6 (cm) W): Tissue and other Non-Viable, Eschar, Exudate, Fibrin/Slough, Skin material debrided: Instrument: Forceps, Scissors Bleeding:  Minimum Hemostasis Achieved: Pressure End Time: 15:39 Procedural Pain: 1 Post Procedural Pain: 1 Response to Treatment: Procedure was tolerated well Post Debridement Measurements of Total Wound Length: (cm) 3 Stage: Unstageable/Unclassified Width: (cm) 4.2 Depth: (cm) 0.3 Volume: (cm) 2.969 Post Procedure Diagnosis Same as Pre-procedure Electronic Signature(s) Signed: 07/15/2015 3:49:43 PM By: Evlyn Kanner MD, FACS Signed: 07/15/2015 4:49:24 PM By: Elliot Gurney RN, BSN, Kim RN, BSN Entered By: Evlyn Kanner on 07/15/2015 15:49:43 Minervini, Deno Etienne ([id number]) BRUNELLA, WILEMAN ([id number]) -------------------------------------------------------------------------------- HPI Details Patient Name: [name] 07/15/2015 2:30 Date of Service: PM Medical Record [id number] Number: Patient Account Number: [id number] 04-Nov-1914 (81 y.o. Treating RN: Huel Coventry Date of Birth/Sex: Female) Other Clinician: Primary Care Physician: Audree Bane Treating Evlyn Kanner Referring Physician: Audree Bane Physician/Extender: Tania Ade in Treatment: 0 History of Present Illness Location: bilateral heel discoloration and ulceration Quality: Patient reports No Pain. Severity: Patient states wound are getting worse. Duration: Patient has had the wound for > 3 months prior to seeking treatment at the wound center Context: The wound appeared gradually over time Modifying Factors: patient is bedridden and stays in bed for most of the day Associated Signs and Symptoms: Wound has significant periowound erythema and localized edema HPI Description: 81 year old patient who has dementia and comes from [facility] nursing home where she has been noted to have bilateral heel discolorations and ulcerations for several months. The patient's son who has the power of attorney is not available and does not have much to do with his mother. Family friend Delorise Shiner has come along with the patient and she knows the  patient for several years and is a Research officer, political party. I understand that the patient has been a DO NOT RESUSCITATE and no aggressive therapy was desired. Electronic Signature(s) Signed: 07/15/2015 3:52:50 PM By: Evlyn Kanner MD, FACS Entered By: Evlyn Kanner on 07/15/2015 15:52:50 [name] ([id number]) -------------------------------------------------------------------------------- Physical Exam Details Patient Name: [name] 07/15/2015 2:30 Date of Service: PM Medical Record [id number] Number: Patient Account Number:  [id number] 11-05-1914 (81 y.o. Treating RN: Huel Coventry Date of Birth/Sex: Female) Other Clinician: Primary Care Physician: Audree Bane Treating Evlyn Kanner Referring Physician: Audree Bane Physician/Extender: Tania Ade in Treatment: 0 Constitutional . Pulse regular. Respirations normal and unlabored. Afebrile. . Eyes Nonicteric. Reactive to light. Ears, Nose, Mouth, and Throat Lips, teeth, and gums WNL.Marland Kitchen Moist mucosa without lesions . Neck supple and nontender. No palpable supraclavicular or cervical adenopathy. Normal sized without goiter. Respiratory WNL. No retractions.. Cardiovascular Pedal Pulses WNL. ABI on the left was 0.5 for the right was 0.50. No clubbing, cyanosis or edema. Gastrointestinal (GI) Abdomen without masses or tenderness.. No liver or spleen enlargement or tenderness.. Lymphatic No adneopathy. No adenopathy. No adenopathy. Musculoskeletal Adexa without tenderness or enlargement.. Digits and nails w/o clubbing, cyanosis, infection, petechiae, ischemia, or inflammatory conditions.. Integumentary (Hair, Skin) No suspicious lesions. No crepitus or fluctuance. No peri-wound warmth or erythema. No masses.Marland Kitchen Psychiatric Judgement and insight Intact.. No evidence of depression, anxiety, or agitation.. Notes The left heel has a dry unstageable eschar and no evidence of erythema or cellulitis. The right heel has a bleb of skin over an  area of unstageable eschar. There is minimal fluid in the pocket once the blister was deroofed. Electronic Signature(s) Signed: 07/15/2015 3:54:19 PM By: Evlyn Kanner MD, FACS Entered By: Evlyn Kanner on 07/15/2015 15:54:18 [name] ([id number]) -------------------------------------------------------------------------------- Physician Orders Details Patient Name: [name] 07/15/2015 2:30 Date of Service: PM Medical Record [id number] Number: Patient Account Number: [id number] April 29, 1915 (81 y.o. Treating RN: Huel Coventry Date of Birth/Sex: Female) Other Clinician: Primary Care Physician: Audree Bane Treating Evlyn Kanner Referring Physician: Audree Bane Physician/Extender: Tania Ade in Treatment: 0 Verbal / Phone Orders: Yes Clinician: Huel Coventry Read Back and Verified: Yes Diagnosis Coding Wound Cleansing Wound #1 Right Calcaneous o Clean wound with Normal Saline. Wound #2 Left Calcaneous o Clean wound with Normal Saline. Anesthetic Wound #1 Right Calcaneous o Topical Lidocaine 4% cream applied to wound bed prior to debridement Wound #2 Left Calcaneous o Topical Lidocaine 4% cream applied to wound bed prior to debridement Primary Wound Dressing Wound #1 Right Calcaneous o Aquacel Ag Wound #2 Left Calcaneous o Other: - betadine Secondary Dressing Wound #1 Right Calcaneous o Boardered Foam Dressing Wound #2 Left Calcaneous o Boardered Foam Dressing Dressing Change Frequency Wound #1 Right Calcaneous o Change dressing every other day. Wound #2 Left Calcaneous o Change dressing every other day. AARTHI, UYENO ([id number]) Follow-up Appointments Wound #1 Right Calcaneous o Return Appointment in 1 week. Wound #2 Left Calcaneous o Return Appointment in 1 week. Off-Loading Wound #1 Right Calcaneous o Heel suspension boot to: - float heels when in the bed, no pressure on heels when sitting up Wound #2 Left Calcaneous o  Heel suspension boot to: - float heels when in the bed, no pressure on heels when sitting up Additional Orders / Instructions Wound #1 Right Calcaneous o Increase protein intake. o Other: - Daily Multivitamin Wound #2 Left Calcaneous o Increase protein intake. o Other: - Daily Multivitamin Electronic Signature(s) Signed: 07/15/2015 3:58:37 PM By: Evlyn Kanner MD, FACS Signed: 07/15/2015 4:49:24 PM By: Elliot Gurney RN, BSN, Kim RN, BSN Entered By: Elliot Gurney, RN, BSN, Kim on 07/15/2015 15:39:40 Tyler, Deno Etienne ([id number]) -------------------------------------------------------------------------------- Problem List Details Patient Name: LEVY, WELLMAN 07/15/2015 2:30 Date of Service: PM Medical Record [id number] Number: Patient Account Number: [id number] Aug 13, 1915 (81 y.o. Treating RN: Huel Coventry Date of Birth/Sex: Female) Other Clinician: Primary Care Physician: Audree Bane Treating Evlyn Kanner Referring Physician: Brooke Dare,  PETER Physician/Extender: Tania Ade in Treatment: 0 Active Problems ICD-10 Encounter Code Description Active Date Diagnosis L89.610 Pressure ulcer of right heel, unstageable 07/15/2015 Yes L89.620 Pressure ulcer of left heel, unstageable 07/15/2015 Yes I70.234 Atherosclerosis of native arteries of right leg with 07/15/2015 Yes ulceration of heel and midfoot I70.244 Atherosclerosis of native arteries of left leg with ulceration 07/15/2015 Yes of heel and midfoot F03.90 Unspecified dementia without behavioral disturbance 07/15/2015 Yes Inactive Problems Resolved Problems Electronic Signature(s) Signed: 07/15/2015 3:47:55 PM By: Evlyn Kanner MD, FACS Entered By: Evlyn Kanner on 07/15/2015 15:47:55 Deady, Deno Etienne ([id number]) -------------------------------------------------------------------------------- Progress Note Details Patient Name: [name] 07/15/2015 2:30 Date of Service: PM Medical Record [id number] Number: Patient Account Number:  [id number] 09-15-1915 (81 y.o. Treating RN: Huel Coventry Date of Birth/Sex: Female) Other Clinician: Primary Care Physician: Audree Bane Treating Evlyn Kanner Referring Physician: Audree Bane Physician/Extender: Tania Ade in Treatment: 0 Subjective Chief Complaint Information obtained from Caregiver Patient is at the clinic for treatment of an open pressure ulcer. The patient has dementia and comes along with her caregiver does not have the power of attorney but is a close friend and will pressure. She says she has had changes of both heels for several months now. History of Present Illness (HPI) The following HPI elements were documented for the patient's wound: Location: bilateral heel discoloration and ulceration Quality: Patient reports No Pain. Severity: Patient states wound are getting worse. Duration: Patient has had the wound for > 3 months prior to seeking treatment at the wound center Context: The wound appeared gradually over time Modifying Factors: patient is bedridden and stays in bed for most of the day Associated Signs and Symptoms: Wound has significant periowound erythema and localized edema 81 year old patient who has dementia and comes from [facility] nursing home where she has been noted to have bilateral heel discolorations and ulcerations for several months. The patient's son who has the power of attorney is not available and does not have much to do with his mother. Family friend Delorise Shiner has come along with the patient and she knows the patient for several years and is a Research officer, political party. I understand that the patient has been a DO NOT RESUSCITATE and no aggressive therapy was desired. Wound History Patient presents with 2 open wounds that have been present for approximately 6+ months. Patient has been treating wounds in the following manner: dry dressings. Laboratory tests have been performed in the last month. Patient reportedly has not tested positive for an  antibiotic resistant organism. Patient reportedly has not tested positive for osteomyelitis. Patient reportedly has not had testing performed to evaluate circulation in the legs. Patient History Information obtained from Caregiver. Allergies Augmentin Balboa, Rudell S. ([id number]) Social History Never smoker, Marital Status - Widowed, Alcohol Use - Never, Drug Use - No History, Caffeine Use - Never. Medical History Hematologic/Lymphatic Patient has history of Anemia Cardiovascular Patient has history of Arrhythmia - AFib, Congestive Heart Failure, Coronary Artery Disease, Deep Vein Thrombosis, Hypertension Genitourinary Patient has history of End Stage Renal Disease - stage 3 Musculoskeletal Patient has history of Osteoarthritis Neurologic Patient has history of Dementia Hospitalization/Surgery History - 10/27/1999, gallbladder . Medical And Surgical History Notes Eyes blind R eye Ear/Nose/Mouth/Throat HOH Cardiovascular Atherosclerotic heart disease Gastrointestinal GERD Oncologic (L) breast; (R) eye; Ovarian Review of Systems (ROS) Constitutional Symptoms (General Health) The patient has no complaints or symptoms. Respiratory The patient has no complaints or symptoms. Endocrine The patient has no complaints or symptoms. Genitourinary Complains or has symptoms of Kidney  failure/ Dialysis. Psychiatric Complains or has symptoms of Anxiety. Medications iron oral 1 1 tablet oral Pizzini, Jahdai S. ([id number]) aspirin 81 mg tablet,delayed release oral 1 1 tablet,delayed release (DR/EC) oral Tylenol Extra Strength 500 mg tablet oral 1 1 tablet oral oxycodone 5 mg tablet oral tablet oral Ultram 50 mg tablet oral 1 1 tablet oral Depakote Sprinkles 125 mg capsule oral 2 2 capsule, sprinkle oral Claritin 10 mg tablet oral tablet oral Vasotec 5 mg tablet oral 1 1 tablet oral Refresh Plus 0.5 % eye drops in a dropperette ophthalmic dropperette ophthalmic metoprolol  tartrate 25 mg tablet oral 1 1 tablet oral Calcium 600 600 mg (1,500 mg) tablet oral 1 1 tablet oral FerrouSul 325 mg (65 mg iron) tablet oral 1 1 tablet oral Milk Of Magnesia Concentrated 2,400 mg/10 mL oral suspension oral suspension oral Senna-S 8.6 mg-50 mg tablet oral 1 1 tablet oral potassium chloride ER 10 mEq tablet,extended release oral 1 1 tablet extended release oral Nexium 40 mg capsule,delayed release oral 1 1 capsule,delayed release(DR/EC) oral Lexapro 20 mg tablet oral 1 1 tablet oral cholecalciferol (vitamin D3) 50,000 unit capsule oral 1 1 capsule oral Objective Constitutional Pulse regular. Respirations normal and unlabored. Afebrile. Vitals Time Taken: 2:43 PM, Temperature: 98.5 F, Pulse: 93 bpm, Respiratory Rate: 24 breaths/min, Blood Pressure: 185/87 mmHg. General Notes: CM notified of elevated BP. MD notified. Eyes Nonicteric. Reactive to light. Ears, Nose, Mouth, and Throat Lips, teeth, and gums WNL.Marland Kitchen Moist mucosa without lesions . Neck supple and nontender. No palpable supraclavicular or cervical adenopathy. Normal sized without goiter. Respiratory WNL. No retractions.. Cardiovascular Pedal Pulses WNL. ABI on the left was 0.5 for the right was 0.50. No clubbing, cyanosis or edema. Gastrointestinal (GI) Abdomen without masses or tenderness.. No liver or spleen enlargement or tenderness.Marland Kitchen MYISHA, PICKEREL ([id number]) Lymphatic No adneopathy. No adenopathy. No adenopathy. Musculoskeletal Adexa without tenderness or enlargement.. Digits and nails w/o clubbing, cyanosis, infection, petechiae, ischemia, or inflammatory conditions.Marland Kitchen Psychiatric Judgement and insight Intact.. No evidence of depression, anxiety, or agitation.. General Notes: The left heel has a dry unstageable eschar and no evidence of erythema or cellulitis. The right heel has a bleb of skin over an area of unstageable eschar. There is minimal fluid in the pocket once the blister was  deroofed. Integumentary (Hair, Skin) No suspicious lesions. No crepitus or fluctuance. No peri-wound warmth or erythema. No masses.. Wound #1 status is Open. Original cause of wound was Pressure Injury. The wound is located on the Right Calcaneous. The wound measures 3cm length x 4.2cm width x 0.1cm depth; 9.896cm^2 area and 0.99cm^3 volume. The wound is limited to skin breakdown. There is no tunneling or undermining noted. There is a medium amount of serosanguineous drainage noted. The wound margin is indistinct and nonvisible. There is no granulation within the wound bed. There is no necrotic tissue within the wound bed. The periwound skin appearance exhibited: Ecchymosis. The periwound skin appearance did not exhibit: Callus, Crepitus, Excoriation, Fluctuance, Friable, Induration, Localized Edema, Rash, Scarring, Dry/Scaly, Maceration, Moist, Atrophie Blanche, Cyanosis, Hemosiderin Staining, Mottled, Pallor, Rubor, Erythema. Periwound temperature was noted as No Abnormality. Wound #2 status is Open. Original cause of wound was Pressure Injury. The wound is located on the Left Calcaneous. The wound measures 3.6cm length x 4.2cm width x 0.3cm depth; 11.875cm^2 area and 3.563cm^3 volume. The wound is limited to skin breakdown. There is a small amount of serosanguineous drainage noted. The wound margin is indistinct and nonvisible. There is no granulation within  the wound bed. There is a medium (34-66%) amount of necrotic tissue within the wound bed including Eschar. The periwound skin appearance exhibited: Ecchymosis. The periwound skin appearance did not exhibit: Callus, Crepitus, Excoriation, Fluctuance, Friable, Induration, Localized Edema, Rash, Scarring, Dry/Scaly, Maceration, Moist, Atrophie Blanche, Cyanosis, Hemosiderin Staining, Mottled, Pallor, Rubor, Erythema. Periwound temperature was noted as No Abnormality. Assessment Active Problems ICD-10 L89.610 - Pressure ulcer of right  heel, unstageable L89.620 - Pressure ulcer of left heel, unstageable I70.234 - Atherosclerosis of native arteries of right leg with ulceration of heel and midfoot I70.244 - Atherosclerosis of native arteries of left leg with ulceration of heel and midfoot Mcnew, Cela S. ([id number]) F03.90 - Unspecified dementia without behavioral disturbance This 81 year old patient who has significant dementia cannot take any decisions for herself nor does she have a power of attorney. The caregiver at the bedside does not have any legal authority and as far as she knows the patient and the family did not want anything aggressive done. At this stage arterial disease is possible but not proven and workup is a moot point as they do not want any therapy. I will not bother doing x-rays but have recommended local care with silver alginate on the right heel and Betadine on the left heel with heel protectors. We have discussed offloading, nutrition, vitamin supplements and he would be happy to see her back on a regular basis. I have spoken with Delorise Shiner the caregiver today that she should try and establish power of attorney or get the son to come and see [place] so that we can have clearly delineated request for treatment. Procedures Wound #1 Wound #1 is a Pressure Ulcer located on the Right Calcaneous . There was a Skin/Dermis Open Wound/Selective [phone]) debridement with total area of 12.6 sq cm performed by Britto, Ignacia Felling., MD. with the following instrument(s): Forceps and Scissors to remove Non-Viable tissue/material including Exudate, Fibrin/Slough, Eschar, and Skin after achieving pain control using Other (lidocaine 4%). A time out was conducted prior to the start of the procedure. A Minimum amount of bleeding was controlled with Pressure. The procedure was tolerated well with a pain level of 1 throughout and a pain level of 1 following the procedure. Post Debridement Measurements: 3cm length x 4.2cm width  x 0.3cm depth; 2.969cm^3 volume. Post debridement Stage noted as Unstageable/Unclassified. Post procedure Diagnosis Wound #1: Same as Pre-Procedure Plan Wound Cleansing: Wound #1 Right Calcaneous: Clean wound with Normal Saline. Wound #2 Left Calcaneous: Clean wound with Normal Saline. Anesthetic: Wound #1 Right Calcaneous: Topical Lidocaine 4% cream applied to wound bed prior to debridement Wamser, Arieona S. ([id number]) Wound #2 Left Calcaneous: Topical Lidocaine 4% cream applied to wound bed prior to debridement Primary Wound Dressing: Wound #1 Right Calcaneous: Aquacel Ag Wound #2 Left Calcaneous: Other: - betadine Secondary Dressing: Wound #1 Right Calcaneous: Boardered Foam Dressing Wound #2 Left Calcaneous: Boardered Foam Dressing Dressing Change Frequency: Wound #1 Right Calcaneous: Change dressing every other day. Wound #2 Left Calcaneous: Change dressing every other day. Follow-up Appointments: Wound #1 Right Calcaneous: Return Appointment in 1 week. Wound #2 Left Calcaneous: Return Appointment in 1 week. Off-Loading: Wound #1 Right Calcaneous: Heel suspension boot to: - float heels when in the bed, no pressure on heels when sitting up Wound #2 Left Calcaneous: Heel suspension boot to: - float heels when in the bed, no pressure on heels when sitting up Additional Orders / Instructions: Wound #1 Right Calcaneous: Increase protein intake. Other: - Daily Multivitamin Wound #2 Left  Calcaneous: Increase protein intake. Other: - Daily Multivitamin This 81 year old patient who has significant dementia cannot take any decisions for herself nor does she have a power of attorney. The caregiver at the bedside does not have any legal authority and as far as she knows the patient and the family did not want anything aggressive done. At this stage arterial disease is possible but not proven and workup is a moot point as they do not want any therapy. I will not bother  doing x-rays but have recommended local care with silver alginate on the right heel and Betadine on the left heel with heel protectors. We have discussed offloading, nutrition, vitamin supplements and he would be happy to see her back on a regular basis. SIMAR, POTHIER ([id number]) I have spoken with Delorise Shiner the caregiver today that she should try and establish power of attorney or get the son to come and see [place] so that we can have clearly delineated request for treatment. Electronic Signature(s) Signed: 07/15/2015 3:56:52 PM By: Evlyn Kanner MD, FACS Entered By: Evlyn Kanner on 07/15/2015 15:56:52 Jia, Deno Etienne ([id number]) -------------------------------------------------------------------------------- ROS/PFSH Details Patient Name: [name] 07/15/2015 2:30 Date of Service: PM Medical Record [id number] Number: Patient Account Number: [id number] [birth year] (81 y.o. Treating RN: Renee Harder Date of Birth/Sex: Female) Other Clinician: Primary Care Physician: Audree Bane Treating Britto, Errol Referring Physician: Audree Bane Physician/Extender: Tania Ade in Treatment: 0 Information Obtained From Caregiver Wound History Do you currently have one or more open woundso Yes How many open wounds do you currently haveo 2 Approximately how long have you had your woundso 6+ months How have you been treating your wound(s) until nowo dry dressings Have you had any lab work done in the past montho Yes Have you tested positive for an antibiotic resistant organism (MRSA, VRE)o No Have you tested positive for osteomyelitis (bone infection)o No Have you had any tests for circulation on your legso No Genitourinary Complaints and Symptoms: Positive for: Kidney failure/ Dialysis Medical History: Positive for: End Stage Renal Disease - stage 3 Psychiatric Complaints and Symptoms: Positive for: Anxiety Constitutional Symptoms (General Health) Complaints and Symptoms: No  Complaints or Symptoms Eyes Medical History: Past Medical History Notes: blind R eye Ear/Nose/Mouth/Throat Medical History: Past Medical History Notes: EARNSTINE, MEINDERS ([id number]) HOH Hematologic/Lymphatic Medical History: Positive for: Anemia Respiratory Complaints and Symptoms: No Complaints or Symptoms Cardiovascular Medical History: Positive for: Arrhythmia - AFib; Congestive Heart Failure; Coronary Artery Disease; Deep Vein Thrombosis; Hypertension Past Medical History Notes: Atherosclerotic heart disease Gastrointestinal Medical History: Past Medical History Notes: GERD Endocrine Complaints and Symptoms: No Complaints or Symptoms Musculoskeletal Medical History: Positive for: Osteoarthritis Neurologic Medical History: Positive for: Dementia Oncologic Medical History: Past Medical History Notes: (L) breast; (R) eye; Ovarian Hospitalization / Surgery History Name of Hospital Purpose of Hospitalization/Surgery Date gallbladder 10/27/1999 Family and Social History JAYCI, ELLEFSON ([id number]) Never smoker; Marital Status - Widowed; Alcohol Use: Never; Drug Use: No History; Caffeine Use: Never; Advanced Directives: Yes (Not Provided); Patient does not want information on Advanced Directives; Do not resuscitate: Yes (Not Provided); Medical Power of Attorney: Yes - son- Aliese Brannum (Not Provided) Physician Affirmation I have reviewed and agree with the above information. Electronic Signature(s) Signed: 07/15/2015 3:23:49 PM By: Evlyn Kanner MD, FACS Signed: 07/29/2015 4:32:11 PM By: Renee Harder RN Entered By: Evlyn Kanner on 07/15/2015 15:23:49 Schlossberg, Deno Etienne ([id number]) -------------------------------------------------------------------------------- SuperBill Details Patient Name: [name] Date of Service: 07/15/2015 Medical Record Number: [id number] Patient Account Number: [id number]  Date of Birth/Sex: 11/28/1914 (81 y.o.  Female) Treating RN: Huel Coventry Primary Care Physician: Audree Bane Other Clinician: Referring Physician: Audree Bane Treating Physician/Extender: Rudene Re in Treatment: 0 Diagnosis Coding ICD-10 Codes Code Description L89.610 Pressure ulcer of right heel, unstageable L89.620 Pressure ulcer of left heel, unstageable I70.234 Atherosclerosis of native arteries of right leg with ulceration of heel and midfoot I70.244 Atherosclerosis of native arteries of left leg with ulceration of heel and midfoot F03.90 Unspecified dementia without behavioral disturbance Facility Procedures CPT4: Description Modifier Quantity Code [id number] - WOUND CARE VISIT-LEV 3 EST PT 1 CPT4: [id number] - DEBRIDE WOUND 1ST 20 SQ CM OR < 1 ICD-10 Description Diagnosis L89.610 Pressure ulcer of right heel, unstageable L89.620 Pressure ulcer of left heel, unstageable I70.234 Atherosclerosis of native arteries of right leg with  ulceration of heel and midfoot I70.244 Atherosclerosis of native arteries of left leg with ulceration of heel and midfoot Physician Procedures CPT4: Description Modifier Quantity Code [id number] - WC PHYS LEVEL 4 - NEW PT 1 ICD-10 Description Diagnosis L89.610 Pressure ulcer of right heel, unstageable L89.620 Pressure ulcer of left heel, unstageable I70.234 Atherosclerosis of native arteries  of right leg with ulceration of heel and midfoot I70.244 Atherosclerosis of native arteries of left leg with ulceration of heel and midfoot Deal, Kathlen SMarland Kitchen ([id number]) Electronic Signature(s) Signed: 07/15/2015 3:57:35 PM By: Evlyn Kanner MD, FACS Entered By: Evlyn Kanner on 07/15/2015 15:57:35

## 2015-07-31 NOTE — Progress Notes (Signed)
[name], [name] ([id number]) Visit Report for 07/29/2015 Arrival Information Details Patient Name: [name], [name] Date of Service: 07/29/2015 1:45 PM Medical Record Number: [id number] Patient Account Number: [id number] Date of Birth/Sex: [birth year] (81 y.o. Female) Treating RN: Renee Harder Primary Care Physician: Audree Bane Other Clinician: Referring Physician: Audree Bane Treating Physician/Extender: Rudene Re in Treatment: 2 Visit Information History Since Last Visit Added or deleted any medications: No Patient Arrived: Wheel Chair Any new allergies or adverse No Arrival Time: 13:53 reactions: Accompanied By: friend Had a fall or experienced change in No Transfer Assistance: Manual activities of daily living that may Patient Identification Verified: Yes affect Secondary Verification Process Yes risk of falls: Completed: Signs or symptoms of abuse/neglect No Patient Has Alerts: Yes since last visito Patient Alerts: Patient on Blood Hospitalized since last visit: No Thinner Has Dressing in Place as Yes ASA 81 mg Prescribed: NO BP (R) arm Pain Present Now: Unable to Respond Electronic Signature(s) Signed: 07/29/2015 4:32:26 PM By: Renee Harder RN Entered By: Renee Harder on 07/29/2015 13:54:14 Bojanowski, [name] ([id number]) -------------------------------------------------------------------------------- Clinic Level of Care Assessment Details Patient Name: [name] Date of Service: 07/29/2015 1:45 PM Medical Record Number: [id number] Patient Account Number: [id number] Date of Birth/Sex: 1915-09-30 (81 y.o. Female) Treating RN: Huel Coventry Primary Care Physician: Audree Bane Other Clinician: Referring Physician: Audree Bane Treating Physician/Extender: Rudene Re in Treatment: 2 Clinic Level of Care Assessment Items TOOL 4 Quantity Score []  - Use when only an EandM is performed on FOLLOW-UP visit 0 ASSESSMENTS - Nursing  Assessment / Reassessment []  - Reassessment of Co-morbidities (includes updates in patient status) 0 X - Reassessment of Adherence to Treatment Plan 1 5 ASSESSMENTS - Wound and Skin Assessment / Reassessment []  - Simple Wound Assessment / Reassessment - one wound 0 X - Complex Wound Assessment / Reassessment - multiple wounds 2 5 []  - Dermatologic / Skin Assessment (not related to wound area) 0 ASSESSMENTS - Focused Assessment []  - Circumferential Edema Measurements - multi extremities 0 []  - Nutritional Assessment / Counseling / Intervention 0 []  - Lower Extremity Assessment (monofilament, tuning fork, pulses) 0 []  - Peripheral Arterial Disease Assessment (using hand held doppler) 0 ASSESSMENTS - Ostomy and/or Continence Assessment and Care []  - Incontinence Assessment and Management 0 []  - Ostomy Care Assessment and Management (repouching, etc.) 0 PROCESS - Coordination of Care X - Simple Patient / Family Education for ongoing care 1 15 []  - Complex (extensive) Patient / Family Education for ongoing care 0 X - Staff obtains Chiropractor, Records, Test Results / Process Orders 1 10 []  - Staff telephones HHA, Nursing Homes / Clarify orders / etc 0 []  - Routine Transfer to another Facility (non-emergent condition) 0 [name], [name] ([id number]) []  - Routine Hospital Admission (non-emergent condition) 0 []  - New Admissions / Manufacturing engineer / Ordering NPWT, Apligraf, etc. 0 []  - Emergency Hospital Admission (emergent condition) 0 X - Simple Discharge Coordination 1 10 []  - Complex (extensive) Discharge Coordination 0 PROCESS - Special Needs []  - Pediatric / Minor Patient Management 0 []  - Isolation Patient Management 0 []  - Hearing / Language / Visual special needs 0 []  - Assessment of Community assistance (transportation, D/C planning, etc.) 0 []  - Additional assistance / Altered mentation 0 []  - Support Surface(s) Assessment (bed, cushion, seat, etc.) 0 INTERVENTIONS - Wound  Cleansing / Measurement []  - Simple Wound Cleansing - one wound 0 X - Complex Wound Cleansing - multiple wounds 2 5 X - Wound  Imaging (photographs - any number of wounds) 1 5  - Wound Tracing (instead of photographs) 0  - Simple Wound Measurement - one wound 0 X - Complex Wound Measurement - multiple wounds 2 5 INTERVENTIONS - Wound Dressings X - Small Wound Dressing one or multiple wounds 2 10  - Medium Wound Dressing one or multiple wounds 0  - Large Wound Dressing one or multiple wounds 0  - Application of Medications - topical 0  - Application of Medications - injection 0 INTERVENTIONS - Miscellaneous  - External ear exam 0 [name], [name] ([id number])  - Specimen Collection (cultures, biopsies, blood, body fluids, etc.) 0  - Specimen(s) / Culture(s) sent or taken to Lab for analysis 0  - Patient Transfer (multiple staff / Michiel Sites Lift / Similar devices) 0  - Simple Staple / Suture removal (25 or less) 0  - Complex Staple / Suture removal (26 or more) 0  - Hypo / Hyperglycemic Management (close monitor of Blood Glucose) 0  - Ankle / Brachial Index (ABI) - do not check if billed separately 0 X - Vital Signs 1 5 Has the patient been seen at the hospital within the last three years: Yes Total Score: 100 Level Of Care: New/Established - Level 3 Electronic Signature(s) Signed: 07/30/2015 5:33:23 PM By: Elliot Gurney, RN, BSN, Kim RN, BSN Entered By: Elliot Gurney, RN, BSN, Kim on 07/29/2015 14:27:24 [name] ([id number]) -------------------------------------------------------------------------------- Encounter Discharge Information Details Patient Name: [name] Date of Service: 07/29/2015 1:45 PM Medical Record Number: [id number] Patient Account Number: [id number] Date of Birth/Sex: 25-May-1915 (81 y.o. Female) Treating RN: Huel Coventry Primary Care Physician: Audree Bane Other Clinician: Referring Physician: Audree Bane Treating Physician/Extender:  Rudene Re in Treatment: 2 Encounter Discharge Information Items Discharge Condition: Stable Ambulatory Status: Ambulatory Discharge Destination: Home Transportation: Private Auto Accompanied By: friend Schedule Follow-up Appointment: Yes Medication Reconciliation completed and provided to Patient/Care No [name]: Provided on Clinical Summary of Care: 07/29/2015 Form Type Recipient Paper Patient DR Electronic Signature(s) Signed: 07/29/2015 4:32:26 PM By: Renee Harder RN Previous Signature: 07/29/2015 2:38:06 PM Version By: Gwenlyn Perking Entered By: Renee Harder on 07/29/2015 16:29:21 [name] ([id number]) -------------------------------------------------------------------------------- Lower Extremity Assessment Details Patient Name: [name] Date of Service: 07/29/2015 1:45 PM Medical Record Number: [id number] Patient Account Number: [id number] Date of Birth/Sex: 1915-01-27 (81 y.o. Female) Treating RN: Renee Harder Primary Care Physician: Audree Bane Other Clinician: Referring Physician: Audree Bane Treating Physician/Extender: Rudene Re in Treatment: 2 Edema Assessment Assessed: [Left: No] [Right: No] E[Left: dema] [Right: :] Calf Left: Right: Point of Measurement: 28 cm From Medial Instep cm cm Ankle Left: Right: Point of Measurement: 9 cm From Medial Instep cm cm Vascular Assessment Pulses: Posterior Tibial Dorsalis Pedis Palpable: [Left:Yes] [Right:Yes] Extremity colors, hair growth, and conditions: Extremity Color: [Left:Normal] [Right:Normal] Hair Growth on Extremity: [Left:No] [Right:No] Temperature of Extremity: [Left:Warm] [Right:Warm] Capillary Refill: [Left:< 3 seconds] [Right:< 3 seconds] Dependent Rubor: [Left:No] [Right:No] Blanched when Elevated: [Left:No] [Right:No] Toe Nail Assessment Left: Right: Thick: Yes Yes Discolored: Yes Yes Deformed: Yes Yes Improper Length and Hygiene: Yes Yes Electronic  Signature(s) Signed: 07/29/2015 4:32:26 PM By: Renee Harder RN Entered By: Renee Harder on 07/29/2015 14:08:21 [name], [name] ([id number]) [name], [name] ([id number]) -------------------------------------------------------------------------------- Multi Wound Chart Details Patient Name: [name] Date of Service: 07/29/2015 1:45 PM Medical Record Number: [id number] Patient Account Number: [id number] Date of Birth/Sex: 12/03/1914 (81 y.o. Female) Treating RN: Huel Coventry Primary Care Physician: Audree Bane Other Clinician: Referring  Physician: Audree Bane Treating Physician/Extender: Rudene Re in Treatment: 2 Vital Signs Height(in): Pulse(bpm): 104 Weight(lbs): Blood Pressure 121/78 (mmHg): Body Mass Index(BMI): Temperature(F): 97.6 Respiratory Rate 12 (breaths/min): Photos: [1:No Photos] [2:No Photos] [N/A:N/A] Wound Location: [1:Right Calcaneous] [2:Left Calcaneous] [N/A:N/A] Wounding Event: [1:Pressure Injury] [2:Pressure Injury] [N/A:N/A] Primary Etiology: [1:Pressure Ulcer] [2:Pressure Ulcer] [N/A:N/A] Comorbid History: [1:Anemia, Arrhythmia, Congestive Heart Failure, Coronary Artery Disease, Deep Vein Thrombosis, Hypertension, End Stage Renal Disease, Osteoarthritis, Dementia] [2:Anemia, Arrhythmia, Congestive Heart Failure, Coronary Artery Disease,  Deep Vein Thrombosis, Hypertension, End Stage Renal Disease, Osteoarthritis, Dementia] [N/A:N/A] Date Acquired: [1:12/25/2014] [2:02/24/2015] [N/A:N/A] Weeks of Treatment: [1:2] [2:2] [N/A:N/A] Wound Status: [1:Open] [2:Open] [N/A:N/A] Measurements L x W x D 2.5x3.5x0.1 [2:2.6x3.4x0.1] [N/A:N/A] (cm) Area (cm) : [1:6.872] [2:6.943] [N/A:N/A] Volume (cm) : [1:0.687] [2:0.694] [N/A:N/A] % Reduction in Area: [1:30.60%] [2:41.50%] [N/A:N/A] % Reduction in Volume: 30.60% [2:80.50%] [N/A:N/A] Classification: [1:Category/Stage III] [2:Unstageable/Unclassified] [N/A:N/A] Exudate Amount: [1:None Present]  [2:None Present] [N/A:N/A] Wound Margin: [1:Indistinct, nonvisible] [2:Indistinct, nonvisible] [N/A:N/A] Granulation Amount: [1:None Present (0%)] [2:None Present (0%)] [N/A:N/A] Necrotic Amount: [1:Large (67-100%)] [2:Large (67-100%)] [N/A:N/A] Necrotic Tissue: [1:N/A] [2:Eschar] [N/A:N/A] Exposed Structures: [1:Fascia: No Fat: No Tendon: No Muscle: No] [2:Fascia: No Fat: No Tendon: No Muscle: No] [N/A:N/A] Joint: No Joint: No Bone: No Bone: No Limited to Skin Limited to Skin Breakdown Breakdown Epithelialization: None None N/A Periwound Skin Texture: Edema: No Edema: No N/A Excoriation: No Excoriation: No Induration: No Induration: No Callus: No Callus: No Crepitus: No Crepitus: No Fluctuance: No Fluctuance: No Friable: No Friable: No Rash: No Rash: No Scarring: No Scarring: No Periwound Skin Dry/Scaly: Yes Dry/Scaly: Yes N/A Moisture: Maceration: No Maceration: No Moist: No Moist: No Periwound Skin Color: Ecchymosis: Yes Ecchymosis: Yes N/A Atrophie Blanche: No Atrophie Blanche: No Cyanosis: No Cyanosis: No Erythema: No Erythema: No Hemosiderin Staining: No Hemosiderin Staining: No Mottled: No Mottled: No Pallor: No Pallor: No Rubor: No Rubor: No Temperature: No Abnormality No Abnormality N/A Tenderness on No No N/A Palpation: Wound Preparation: Ulcer Cleansing: Ulcer Cleansing: N/A Rinsed/Irrigated with Rinsed/Irrigated with Saline Saline Topical Anesthetic Topical Anesthetic Applied: Other: Lidocaine Applied: Other: Lidocaine 4% Ointment 4% Ointment Treatment Notes Electronic Signature(s) Signed: 07/30/2015 5:33:23 PM By: Elliot Gurney, RN, BSN, Kim RN, BSN Entered By: Elliot Gurney, RN, BSN, Kim on 07/29/2015 14:23:11 [name], [name] ([id number]) -------------------------------------------------------------------------------- Multi-Disciplinary Care Plan Details Patient Name: [name] Date of Service: 07/29/2015 1:45 PM Medical Record Number:  [id number] Patient Account Number: [id number] Date of Birth/Sex: 1915-08-31 (81 y.o. Female) Treating RN: Huel Coventry Primary Care Physician: Audree Bane Other Clinician: Referring Physician: Audree Bane Treating Physician/Extender: Rudene Re in Treatment: 2 Active Inactive Abuse / Safety / Falls / Self Care Management Nursing Diagnoses: Potential for falls Goals: Patient will remain injury free Date Initiated: 07/15/2015 Goal Status: Active Interventions: Assess fall risk on admission and as needed Notes: Nutrition Nursing Diagnoses: Potential for alteratiion in Nutrition/Potential for imbalanced nutrition Goals: Patient/caregiver agrees to and verbalizes understanding of need to use nutritional supplements and/or vitamins as prescribed Date Initiated: 07/15/2015 Goal Status: Active Interventions: Assess patient nutrition upon admission and as needed per policy Notes: Orientation to the Wound Care Program Nursing Diagnoses: Knowledge deficit related to the wound healing center program Goals: Patient/caregiver will verbalize understanding of the [facility] [address] [place], [place] ([id number]) Date Initiated: 07/15/2015 Goal Status: Active Interventions: Provide education on orientation to the wound center Notes: Pressure Nursing Diagnoses: Knowledge deficit related to management of pressures ulcers Goals: Patient will remain free from development of additional pressure  ulcers Date Initiated: 07/15/2015 Goal Status: Active Interventions: Assess: immobility, friction, shearing, incontinence upon admission and as needed Provide education on pressure ulcers Notes: Wound/Skin Impairment Nursing Diagnoses: Impaired tissue integrity Goals: Patient/caregiver will verbalize understanding of skin care regimen Date Initiated: 07/15/2015 Goal Status: Active Interventions: Assess patient/caregiver ability to obtain necessary supplies Treatment  Activities: Topical wound management initiated : 07/29/2015 Notes: Electronic Signature(s) Signed: 07/30/2015 5:33:23 PM By: Elliot Gurney, RN, BSN, Kim RN, BSN Entered By: Elliot Gurney, RN, BSN, Kim on 07/29/2015 14:22:59 [name], [name] ([id number]) -------------------------------------------------------------------------------- Pain Assessment Details Patient Name: [name] Date of Service: 07/29/2015 1:45 PM Medical Record Number: [id number] Patient Account Number: [id number] Date of Birth/Sex: 1915-03-29 (81 y.o. Female) Treating RN: Renee Harder Primary Care Physician: Audree Bane Other Clinician: Referring Physician: Audree Bane Treating Physician/Extender: Rudene Re in Treatment: 2 Active Problems Location of Pain Severity and Description of Pain Patient Has Paino Patient Unable to Respond Site Locations Pain Management and Medication Current Pain Management: Electronic Signature(s) Signed: 07/29/2015 4:32:26 PM By: Renee Harder RN Entered By: Renee Harder on 07/29/2015 13:57:45 [name], [name] ([id number]) -------------------------------------------------------------------------------- Patient/Caregiver Education Details Patient Name: [name] Date of Service: 07/29/2015 1:45 PM Medical Record Number: [id number] Patient Account Number: [id number] Date of Birth/Gender: April 01, 1915 (81 y.o. Female) Treating RN: Renee Harder Primary Care Physician: Audree Bane Other Clinician: Referring Physician: Audree Bane Treating Physician/Extender: Rudene Re in Treatment: 2 Education Assessment Education Provided To: Caregiver friend Education Topics Provided Offloading: Methods: Explain/Verbal Responses: State content correctly Pressure: Methods: Explain/Verbal Responses: State content correctly Wound Debridement: Methods: Explain/Verbal Responses: State content correctly Wound/Skin Impairment: Methods: Explain/Verbal Responses: State  content correctly Electronic Signature(s) Signed: 07/29/2015 4:32:26 PM By: Renee Harder RN Entered By: Renee Harder on 07/29/2015 14:51:42 [name], [name] ([id number]) -------------------------------------------------------------------------------- Wound Assessment Details Patient Name: [name] Date of Service: 07/29/2015 1:45 PM Medical Record Number: [id number] Patient Account Number: [id number] Date of Birth/Sex: 28-Jul-1915 (81 y.o. Female) Treating RN: Renee Harder Primary Care Physician: Audree Bane Other Clinician: Referring Physician: Audree Bane Treating Physician/Extender: Rudene Re in Treatment: 2 Wound Status Wound Number: 1 Primary Pressure Ulcer Etiology: Wound Location: Right Calcaneous Wound Open Wounding Event: Pressure Injury Status: Date Acquired: 12/25/2014 Comorbid Anemia, Arrhythmia, Congestive Heart Weeks Of Treatment: 2 History: Failure, Coronary Artery Disease, Deep Clustered Wound: No Vein Thrombosis, Hypertension, End Stage Renal Disease, Osteoarthritis, Dementia Photos Photo Uploaded By: Elliot Gurney, RN, BSN, Kim on 07/29/2015 17:03:16 Wound Measurements Length: (cm) 2.5 % Reduction in Width: (cm) 3.5 % Reduction in Depth: (cm) 0.1 Epithelializati Area: (cm) 6.872 Tunneling: Volume: (cm) 0.687 Undermining: Area: 30.6% Volume: 30.6% on: None No No Wound Description Classification: Category/Stage III Wound Margin: Indistinct, nonvisible Exudate Amount: None Present Foul Odor After Cleansing: No Wound Bed Granulation Amount: None Present (0%) Exposed Structure Necrotic Amount: Large (67-100%) Fascia Exposed: No Fat Layer Exposed: No Tendon Exposed: No [name], [name] ([id number]) Muscle Exposed: No Joint Exposed: No Bone Exposed: No Limited to Skin Breakdown Periwound Skin Texture Texture Color No Abnormalities Noted: No No Abnormalities Noted: No Callus: No Atrophie Blanche: No Crepitus: No Cyanosis:  No Excoriation: No Ecchymosis: Yes Fluctuance: No Erythema: No Friable: No Hemosiderin Staining: No Induration: No Mottled: No Localized Edema: No Pallor: No Rash: No Rubor: No Scarring: No Temperature / Pain Moisture Temperature: No Abnormality No Abnormalities Noted: No Dry / Scaly: Yes Maceration: No Moist: No Wound Preparation Ulcer Cleansing: Rinsed/Irrigated with Saline Topical Anesthetic Applied: Other: Lidocaine 4% Ointment, Treatment Notes Wound #1 (Right  Calcaneous) 1. Cleansed with: Clean wound with Normal Saline 2. Anesthetic Topical Lidocaine 4% cream to wound bed prior to debridement 4. Dressing Applied: Other dressing (specify in notes) 5. Secondary Dressing Applied Bordered Foam Dressing Notes betadine paint Electronic Signature(s) Signed: 07/29/2015 4:32:26 PM By: Renee Harder RN Entered By: Renee Harder on 07/29/2015 14:14:53 [name], [name] ([id number]) -------------------------------------------------------------------------------- Wound Assessment Details Patient Name: [name] Date of Service: 07/29/2015 1:45 PM Medical Record Number: [id number] Patient Account Number: [id number] Date of Birth/Sex: [birth year] (81 y.o. Female) Treating RN: Renee Harder Primary Care Physician: Audree Bane Other Clinician: Referring Physician: Audree Bane Treating Physician/Extender: Rudene Re in Treatment: 2 Wound Status Wound Number: 2 Primary Pressure Ulcer Etiology: Wound Location: Left Calcaneous Wound Open Wounding Event: Pressure Injury Status: Date Acquired: 02/24/2015 Comorbid Anemia, Arrhythmia, Congestive Heart Weeks Of Treatment: 2 History: Failure, Coronary Artery Disease, Deep Clustered Wound: No Vein Thrombosis, Hypertension, End Stage Renal Disease, Osteoarthritis, Dementia Photos Photo Uploaded By: Elliot Gurney, RN, BSN, Kim on 07/29/2015 17:03:17 Wound Measurements Length: (cm) 2.6 % Reduction in Width: (cm) 3.4 %  Reduction in Depth: (cm) 0.1 Epithelializati Area: (cm) 6.943 Tunneling: Volume: (cm) 0.694 Undermining: Area: 41.5% Volume: 80.5% on: None No No Wound Description Classification: Unstageable/Unclassified Wound Margin: Indistinct, nonvisible Exudate Amount: None Present Foul Odor After Cleansing: No Wound Bed Granulation Amount: None Present (0%) Exposed Structure Necrotic Amount: Large (67-100%) Fascia Exposed: No Necrotic Quality: Eschar Fat Layer Exposed: No Tendon Exposed: No Ruppert, Ewa S. ([id number]) Muscle Exposed: No Joint Exposed: No Bone Exposed: No Limited to Skin Breakdown Periwound Skin Texture Texture Color No Abnormalities Noted: No No Abnormalities Noted: No Callus: No Atrophie Blanche: No Crepitus: No Cyanosis: No Excoriation: No Ecchymosis: Yes Fluctuance: No Erythema: No Friable: No Hemosiderin Staining: No Induration: No Mottled: No Localized Edema: No Pallor: No Rash: No Rubor: No Scarring: No Temperature / Pain Moisture Temperature: No Abnormality No Abnormalities Noted: No Dry / Scaly: Yes Maceration: No Moist: No Wound Preparation Ulcer Cleansing: Rinsed/Irrigated with Saline Topical Anesthetic Applied: Other: Lidocaine 4% Ointment, Treatment Notes Wound #2 (Left Calcaneous) 1. Cleansed with: Clean wound with Normal Saline 2. Anesthetic Topical Lidocaine 4% cream to wound bed prior to debridement 4. Dressing Applied: Other dressing (specify in notes) 5. Secondary Dressing Applied Bordered Foam Dressing Notes betadine paint Electronic Signature(s) Signed: 07/29/2015 4:32:26 PM By: Renee Harder RN Entered By: Renee Harder on 07/29/2015 14:15:11 [place], [name] ([id number]) -------------------------------------------------------------------------------- Vitals Details Patient Name: [name] Date of Service: 07/29/2015 1:45 PM Medical Record Number: [id number] Patient Account Number: [id number] Date  of Birth/Sex: 1915/03/02 (81 y.o. Female) Treating RN: Renee Harder Primary Care Physician: Audree Bane Other Clinician: Referring Physician: Audree Bane Treating Physician/Extender: Rudene Re in Treatment: 2 Vital Signs Time Taken: 13:57 Temperature (F): 97.6 Pulse (bpm): 104 Respiratory Rate (breaths/min): 12 Blood Pressure (mmHg): 121/78 Reference Range: 80 - 120 mg / dl Notes temperature taken in axilla Electronic Signature(s) Signed: 07/29/2015 4:32:26 PM By: Renee Harder RN Entered By: Renee Harder on 07/29/2015 14:01:16

## 2015-07-31 NOTE — Progress Notes (Signed)
DARIKA, ILDEFONSO ([id number]) Visit Report for 07/29/2015 Chief Complaint Document Details Patient Name: [name], [name] 07/29/2015 1:45 Date of Service: PM Medical Record [id number] Number: Patient Account Number: [id number] [birth year] (81 y.o. Treating RN: Huel Coventry Date of Birth/Sex: Female) Other Clinician: Primary Care Physician: Audree Bane Treating Evlyn Kanner Referring Physician: Audree Bane Physician/Extender: Tania Ade in Treatment: 2 Information Obtained from: Caregiver Chief Complaint Patient is at the clinic for treatment of an open pressure ulcer. The patient has dementia and comes along with her caregiver does not have the power of attorney but is a close friend and will pressure. She says she has had changes of both heels for several months now. Electronic Signature(s) Signed: 07/29/2015 2:36:51 PM By: Evlyn Kanner MD, FACS Entered By: Evlyn Kanner on 07/29/2015 14:36:51 [name] ([id number]) -------------------------------------------------------------------------------- HPI Details Patient Name: [name] 07/29/2015 1:45 Date of Service: PM Medical Record [id number] Number: Patient Account Number: [id number] 02/24/1915 (81 y.o. Treating RN: Huel Coventry Date of Birth/Sex: Female) Other Clinician: Primary Care Physician: Audree Bane Treating Evlyn Kanner Referring Physician: Audree Bane Physician/Extender: Tania Ade in Treatment: 2 History of Present Illness Location: bilateral heel discoloration and ulceration Quality: Patient reports No Pain. Severity: Patient states wound are getting worse. Duration: Patient has had the wound for > 3 months prior to seeking treatment at the wound center Context: The wound appeared gradually over time Modifying Factors: patient is bedridden and stays in bed for most of the day Associated Signs and Symptoms: Wound has significant periowound erythema and localized edema HPI Description: 81 year old  patient who has dementia and comes from [facility] nursing home where she has been noted to have bilateral heel discolorations and ulcerations for several months. The patient's son who has the power of attorney is not available and does not have much to do with his mother. Family friend [name] has come along with the patient and she knows the patient for several years and is a Research officer, political party. I understand that the patient has been a DO NOT RESUSCITATE and no aggressive therapy was desired. 07/29/2015 -- the son is still not come to sign for her care and we do not have a power of attorney. Her friend [name] is at the bedside. Electronic Signature(s) Signed: 07/29/2015 2:37:37 PM By: Evlyn Kanner MD, FACS Entered By: Evlyn Kanner on 07/29/2015 14:37:37 [name] ([id number]) -------------------------------------------------------------------------------- Physical Exam Details Patient Name: [name] 07/29/2015 1:45 Date of Service: PM Medical Record [id number] Number: Patient Account Number: [id number] 12-23-1914 (81 y.o. Treating RN: Huel Coventry Date of Birth/Sex: Female) Other Clinician: Primary Care Physician: Audree Bane Treating Evlyn Kanner Referring Physician: Audree Bane Physician/Extender: Tania Ade in Treatment: 2 Constitutional . Pulse regular. Respirations normal and unlabored. Afebrile. . Eyes Nonicteric. Reactive to light. Ears, Nose, Mouth, and Throat Lips, teeth, and gums WNL.Marland Kitchen Moist mucosa without lesions . Neck supple and nontender. No palpable supraclavicular or cervical adenopathy. Normal sized without goiter. Respiratory WNL. No retractions.. Cardiovascular Pedal Pulses WNL. No clubbing, cyanosis or edema. Lymphatic No adneopathy. No adenopathy. No adenopathy. Musculoskeletal Adexa without tenderness or enlargement.. Digits and nails w/o clubbing, cyanosis, infection, petechiae, ischemia, or inflammatory conditions.. Integumentary (Hair,  Skin) No suspicious lesions. No crepitus or fluctuance. No peri-wound warmth or erythema. No masses.Marland Kitchen Psychiatric Judgement and insight Intact.. No evidence of depression, anxiety, or agitation.. Notes both the heels have dry eschar and both of them are fairly rigid and unremarkable. There is no cellulitis or surrounding erythema. Electronic Signature(s) Signed: 07/29/2015 2:38:39 PM By:  Evlyn Kanner MD, FACS Entered By: Evlyn Kanner on 07/29/2015 14:38:38 [name] ([id number]) -------------------------------------------------------------------------------- Physician Orders Details Patient Name: [name] 07/29/2015 1:45 Date of Service: PM Medical Record [id number] Number: Patient Account Number: [id number] Jan 31, 1915 (81 y.o. Treating RN: Huel Coventry Date of Birth/Sex: Female) Other Clinician: Primary Care Physician: Audree Bane Treating Evlyn Kanner Referring Physician: Audree Bane Physician/Extender: Tania Ade in Treatment: 2 Verbal / Phone Orders: Yes Clinician: Huel Coventry Read Back and Verified: Yes Diagnosis Coding Wound Cleansing Wound #1 Right Calcaneous o Clean wound with Normal Saline. o Cleanse wound with mild soap and water Wound #2 Left Calcaneous o Clean wound with Normal Saline. o Cleanse wound with mild soap and water Anesthetic Wound #1 Right Calcaneous o Topical Lidocaine 4% cream applied to wound bed prior to debridement Wound #2 Left Calcaneous o Topical Lidocaine 4% cream applied to wound bed prior to debridement Primary Wound Dressing Wound #1 Right Calcaneous o Other: - Paint wounds with Betadine Wound #2 Left Calcaneous o Other: - Paint wounds with Betadine Secondary Dressing Wound #1 Right Calcaneous o Boardered Foam Dressing Wound #2 Left Calcaneous o Boardered Foam Dressing Dressing Change Frequency Wound #1 Right Calcaneous o Change dressing every other day. [name], [name] ([id number]) Wound #2  Left Calcaneous o Change dressing every other day. Follow-up Appointments Wound #1 Right Calcaneous o Return Appointment in 1 week. Wound #2 Left Calcaneous o Return Appointment in 1 week. Off-Loading Wound #1 Right Calcaneous o Heel suspension boot to: - float heels when in the bed, no pressure on heels when sitting up Wound #2 Left Calcaneous o Heel suspension boot to: - float heels when in the bed, no pressure on heels when sitting up Additional Orders / Instructions Wound #1 Right Calcaneous o Increase protein intake. o Other: - Daily Multivitamin Wound #2 Left Calcaneous o Increase protein intake. o Other: - Daily Multivitamin Electronic Signature(s) Signed: 07/29/2015 4:36:30 PM By: Evlyn Kanner MD, FACS Signed: 07/30/2015 5:33:23 PM By: Elliot Gurney RN, BSN, Kim RN, BSN Entered By: Elliot Gurney, RN, BSN, Kim on 07/29/2015 14:25:30 Frandsen, [name] ([id number]) -------------------------------------------------------------------------------- Problem List Details Patient Name: KADIJAH, SHAMOON 07/29/2015 1:45 Date of Service: PM Medical Record [id number] Number: Patient Account Number: [id number] September 11, 1915 (81 y.o. Treating RN: Huel Coventry Date of Birth/Sex: Female) Other Clinician: Primary Care Physician: Audree Bane Treating Evlyn Kanner Referring Physician: Audree Bane Physician/Extender: Tania Ade in Treatment: 2 Active Problems ICD-10 Encounter Code Description Active Date Diagnosis L89.610 Pressure ulcer of right heel, unstageable 07/15/2015 Yes L89.620 Pressure ulcer of left heel, unstageable 07/15/2015 Yes I70.234 Atherosclerosis of native arteries of right leg with 07/15/2015 Yes ulceration of heel and midfoot I70.244 Atherosclerosis of native arteries of left leg with ulceration 07/15/2015 Yes of heel and midfoot F03.90 Unspecified dementia without behavioral disturbance 07/15/2015 Yes Inactive Problems Resolved Problems Electronic  Signature(s) Signed: 07/29/2015 2:36:35 PM By: Evlyn Kanner MD, FACS Entered By: Evlyn Kanner on 07/29/2015 14:36:34 Meunier, [name] ([id number]) -------------------------------------------------------------------------------- Progress Note Details Patient Name: [name] 07/29/2015 1:45 Date of Service: PM Medical Record [id number] Number: Patient Account Number: [id number] 27-May-1915 (81 y.o. Treating RN: Huel Coventry Date of Birth/Sex: Female) Other Clinician: Primary Care Physician: Audree Bane Treating Evlyn Kanner Referring Physician: Audree Bane Physician/Extender: Tania Ade in Treatment: 2 Subjective Chief Complaint Information obtained from Caregiver Patient is at the clinic for treatment of an open pressure ulcer. The patient has dementia and comes along with her caregiver does not have the power of attorney but is a close friend  and will pressure. She says she has had changes of both heels for several months now. History of Present Illness (HPI) The following HPI elements were documented for the patient's wound: Location: bilateral heel discoloration and ulceration Quality: Patient reports No Pain. Severity: Patient states wound are getting worse. Duration: Patient has had the wound for > 3 months prior to seeking treatment at the wound center Context: The wound appeared gradually over time Modifying Factors: patient is bedridden and stays in bed for most of the day Associated Signs and Symptoms: Wound has significant periowound erythema and localized edema 81 year old patient who has dementia and comes from [facility] nursing home where she has been noted to have bilateral heel discolorations and ulcerations for several months. The patient's son who has the power of attorney is not available and does not have much to do with his mother. Family friend [name] has come along with the patient and she knows the patient for several years and is a Research officer, political party. I  understand that the patient has been a DO NOT RESUSCITATE and no aggressive therapy was desired. 07/29/2015 -- the son is still not come to sign for her care and we do not have a power of attorney. Her friend [name] is at the bedside. Objective Constitutional Pulse regular. Respirations normal and unlabored. Afebrile. [name], [name] ([id number]) Vitals Time Taken: 1:57 PM, Temperature: 97.6 F, Pulse: 104 bpm, Respiratory Rate: 12 breaths/min, Blood Pressure: 121/78 mmHg. General Notes: temperature taken in axilla Eyes Nonicteric. Reactive to light. Ears, Nose, Mouth, and Throat Lips, teeth, and gums WNL.Marland Kitchen Moist mucosa without lesions . Neck supple and nontender. No palpable supraclavicular or cervical adenopathy. Normal sized without goiter. Respiratory WNL. No retractions.. Cardiovascular Pedal Pulses WNL. No clubbing, cyanosis or edema. Lymphatic No adneopathy. No adenopathy. No adenopathy. Musculoskeletal Adexa without tenderness or enlargement.. Digits and nails w/o clubbing, cyanosis, infection, petechiae, ischemia, or inflammatory conditions.Marland Kitchen Psychiatric Judgement and insight Intact.. No evidence of depression, anxiety, or agitation.. General Notes: both the heels have dry eschar and both of them are fairly rigid and unremarkable. There is no cellulitis or surrounding erythema. Integumentary (Hair, Skin) No suspicious lesions. No crepitus or fluctuance. No peri-wound warmth or erythema. No masses.. Wound #1 status is Open. Original cause of wound was Pressure Injury. The wound is located on the Right Calcaneous. The wound measures 2.5cm length x 3.5cm width x 0.1cm depth; 6.872cm^2 area and 0.687cm^3 volume. The wound is limited to skin breakdown. There is no tunneling or undermining noted. There is a none present amount of drainage noted. The wound margin is indistinct and nonvisible. There is no granulation within the wound bed. There is a large (67-100%) amount of  necrotic tissue within the wound bed. The periwound skin appearance exhibited: Dry/Scaly, Ecchymosis. The periwound skin appearance did not exhibit: Callus, Crepitus, Excoriation, Fluctuance, Friable, Induration, Localized Edema, Rash, Scarring, Maceration, Moist, Atrophie Blanche, Cyanosis, Hemosiderin Staining, Mottled, Pallor, Rubor, Erythema. Periwound temperature was noted as No Abnormality. Wound #2 status is Open. Original cause of wound was Pressure Injury. The wound is located on the Left Calcaneous. The wound measures 2.6cm length x 3.4cm width x 0.1cm depth; 6.943cm^2 area and 0.694cm^3 volume. The wound is limited to skin breakdown. There is no tunneling or undermining noted. There is a none present amount of drainage noted. The wound margin is indistinct and nonvisible. There is [name], [name] ([id number]) no granulation within the wound bed. There is a large (67-100%) amount of necrotic tissue within the wound  bed including Eschar. The periwound skin appearance exhibited: Dry/Scaly, Ecchymosis. The periwound skin appearance did not exhibit: Callus, Crepitus, Excoriation, Fluctuance, Friable, Induration, Localized Edema, Rash, Scarring, Maceration, Moist, Atrophie Blanche, Cyanosis, Hemosiderin Staining, Mottled, Pallor, Rubor, Erythema. Periwound temperature was noted as No Abnormality. Assessment Active Problems ICD-10 L89.610 - Pressure ulcer of right heel, unstageable L89.620 - Pressure ulcer of left heel, unstageable I70.234 - Atherosclerosis of native arteries of right leg with ulceration of heel and midfoot I70.244 - Atherosclerosis of native arteries of left leg with ulceration of heel and midfoot F03.90 - Unspecified dementia without behavioral disturbance I have recommended at this stage we will continue to paint with Betadine and use heel protectors and also the Sage boot's. The caregiver at the bedside has been urged to talk to the nursing home regarding using  cushions under her carbs to elevate her heels and to continue adequate nutrition and vitamin supplements. I have also asked for a part of attorney to be optimized so that in case we need to debride this wound we have someone to authorize it. Plan Wound Cleansing: Wound #1 Right Calcaneous: Clean wound with Normal Saline. Cleanse wound with mild soap and water Wound #2 Left Calcaneous: Clean wound with Normal Saline. Cleanse wound with mild soap and water Anesthetic: Wound #1 Right Calcaneous: Topical Lidocaine 4% cream applied to wound bed prior to debridement Wound #2 Left Calcaneous: Topical Lidocaine 4% cream applied to wound bed prior to debridement Primary Wound Dressing: [name], [name] ([id number]) Wound #1 Right Calcaneous: Other: - Paint wounds with Betadine Wound #2 Left Calcaneous: Other: - Paint wounds with Betadine Secondary Dressing: Wound #1 Right Calcaneous: Boardered Foam Dressing Wound #2 Left Calcaneous: Boardered Foam Dressing Dressing Change Frequency: Wound #1 Right Calcaneous: Change dressing every other day. Wound #2 Left Calcaneous: Change dressing every other day. Follow-up Appointments: Wound #1 Right Calcaneous: Return Appointment in 1 week. Wound #2 Left Calcaneous: Return Appointment in 1 week. Off-Loading: Wound #1 Right Calcaneous: Heel suspension boot to: - float heels when in the bed, no pressure on heels when sitting up Wound #2 Left Calcaneous: Heel suspension boot to: - float heels when in the bed, no pressure on heels when sitting up Additional Orders / Instructions: Wound #1 Right Calcaneous: Increase protein intake. Other: - Daily Multivitamin Wound #2 Left Calcaneous: Increase protein intake. Other: - Daily Multivitamin I have recommended at this stage we will continue to paint with Betadine and use heel protectors and also the Sage boot's. The caregiver at the bedside has been urged to talk to the nursing home regarding  using cushions under her carbs to elevate her heels and to continue adequate nutrition and vitamin supplements. I have also asked for a part of attorney to be optimized so that in case we need to debride this wound we have someone to authorize it. Electronic Signature(s) Signed: 07/29/2015 2:40:19 PM By: Evlyn Kanner MD, FACS [name], [name] ([id number]) Entered By: Evlyn Kanner on 07/29/2015 14:40:18 [name] ([id number]) -------------------------------------------------------------------------------- SuperBill Details Patient Name: [name] Date of Service: 07/29/2015 Medical Record Number: [id number] Patient Account Number: [id number] Date of Birth/Sex: Dec 20, 1914 (81 y.o. Female) Treating RN: Huel Coventry Primary Care Physician: Audree Bane Other Clinician: Referring Physician: Audree Bane Treating Physician/Extender: Rudene Re in Treatment: 2 Diagnosis Coding ICD-10 Codes Code Description L89.610 Pressure ulcer of right heel, unstageable L89.620 Pressure ulcer of left heel, unstageable I70.234 Atherosclerosis of native arteries of right leg with ulceration of heel and midfoot I70.244  Atherosclerosis of native arteries of left leg with ulceration of heel and midfoot F03.90 Unspecified dementia without behavioral disturbance Facility Procedures CPT4 Code: 16109604 Description: 99213 - WOUND CARE VISIT-LEV 3 EST PT Modifier: Quantity: 1 Physician Procedures CPT4: Description Modifier Quantity Code [id number] 99213 - WC PHYS LEVEL 3 - EST PT 1 ICD-10 Description Diagnosis L89.610 Pressure ulcer of right heel, unstageable L89.620 Pressure ulcer of left heel, unstageable I70.234 Atherosclerosis of native arteries  of right leg with ulceration of heel and midfoot I70.244 Atherosclerosis of native arteries of left leg with ulceration of heel and midfoot Electronic Signature(s) Signed: 07/29/2015 2:40:57 PM By: Evlyn Kanner MD, FACS Entered By: Evlyn Kanner on 07/29/2015 14:40:56

## 2015-08-05 ENCOUNTER — Encounter (HOSPITAL_BASED_OUTPATIENT_CLINIC_OR_DEPARTMENT_OTHER): Payer: Medicare Other | Admitting: General Surgery

## 2015-08-05 DIAGNOSIS — L89613 Pressure ulcer of right heel, stage 3: Secondary | ICD-10-CM

## 2015-08-05 DIAGNOSIS — L89610 Pressure ulcer of right heel, unstageable: Secondary | ICD-10-CM | POA: Diagnosis not present

## 2015-08-05 DIAGNOSIS — L89623 Pressure ulcer of left heel, stage 3: Secondary | ICD-10-CM

## 2015-08-05 NOTE — Progress Notes (Signed)
seeiheal 

## 2015-08-06 NOTE — Progress Notes (Addendum)
RIANN, OMAN ([id number]) Visit Report for 08/05/2015 Chief Complaint Document Details Patient Name: [name], [name] 08/05/2015 3:15 Date of Service: PM Medical Record [id number] Number: Patient Account Number: [id number] 1915/06/18 (81 y.o. Treating RN: Curtis Sites Date of Birth/Sex: Female) Other Clinician: Primary Care Physician: Donnamarie Rossetti, Mordecai Tindol Referring Physician: Audree Bane Physician/Extender: Tania Ade in Treatment: 3 Information Obtained from: Caregiver Chief Complaint Patient is at the clinic for treatment of an open pressure ulcer. The patient has dementia and comes along with her caregiver does not have the power of attorney but is a close friend and will pressure. She says she has had changes of both heels for several months now. Electronic Signature(s) Signed: 08/05/2015 5:36:04 PM By: Ardath Sax MD Entered By: Ardath Sax on 08/05/2015 17:20:45 [name] ([id number]) -------------------------------------------------------------------------------- Debridement Details Patient Name: [name] 08/05/2015 3:15 Date of Service: PM Medical Record [id number] Number: Patient Account Number: [id number] 02-Aug-1915 (81 y.o. Treating RN: Curtis Sites Date of Birth/Sex: Female) Other Clinician: Primary Care Physician: Donnamarie Rossetti, Nayson Traweek Referring Physician: Audree Bane Physician/Extender: Tania Ade in Treatment: 3 Debridement Performed for Wound #2 Left Calcaneous Assessment: Performed By: Physician Ardath Sax, MD Debridement: Debridement Pre-procedure Yes Verification/Time Out Taken: Start Time: 15:53 Pain Control: Lidocaine 4% Topical Solution Level: Skin/Subcutaneous Tissue Total Area Debrided (L x 4.6 (cm) x 5 (cm) = 23 (cm) W): Tissue and other Non-Viable, Eschar, Fibrin/Slough, Skin, Subcutaneous material debrided: Instrument: Forceps, Scissors Bleeding: None End Time: 15:56 Procedural  Pain: 0 Post Procedural Pain: 0 Response to Treatment: Procedure was tolerated well Post Debridement Measurements of Total Wound Length: (cm) 4.6 Stage: Unstageable/Unclassified Width: (cm) 5 Depth: (cm) 0.1 Volume: (cm) 1.806 Post Procedure Diagnosis Same as Pre-procedure Electronic Signature(s) Signed: 08/05/2015 5:24:25 PM By: Curtis Sites Entered By: Curtis Sites on 08/05/2015 15:56:39 [name], [name] ([id number]) -------------------------------------------------------------------------------- Debridement Details Patient Name: [name] 08/05/2015 3:15 Date of Service: PM Medical Record [id number] Number: Patient Account Number: [id number] 1915/03/16 (81 y.o. Treating RN: Curtis Sites Date of Birth/Sex: Female) Other Clinician: Primary Care Physician: Donnamarie Rossetti, Gwenevere Goga Referring Physician: Audree Bane Physician/Extender: Tania Ade in Treatment: 3 Debridement Performed for Wound #1 Right Calcaneous Assessment: Performed By: Physician Ardath Sax, MD Debridement: Debridement Pre-procedure Yes Verification/Time Out Taken: Start Time: 15:56 Pain Control: Lidocaine 4% Topical Solution Level: Skin/Subcutaneous Tissue Total Area Debrided (L x 2.4 (cm) x 2.6 (cm) = 6.24 (cm) W): Tissue and other Non-Viable, Eschar, Fibrin/Slough, Skin, Subcutaneous material debrided: Instrument: Blade, Forceps Bleeding: Minimum Hemostasis Achieved: Pressure End Time: 15:59 Procedural Pain: 0 Post Procedural Pain: 0 Response to Treatment: Procedure was tolerated well Post Debridement Measurements of Total Wound Length: (cm) 2.4 Stage: Category/Stage III Width: (cm) 2.6 Depth: (cm) 0.1 Volume: (cm) 0.49 Post Procedure Diagnosis Same as Pre-procedure Electronic Signature(s) Signed: 08/05/2015 5:24:25 PM By: Curtis Sites Entered By: Curtis Sites on 08/05/2015 15:57:41 [name], [name]  ([id number]) -------------------------------------------------------------------------------- HPI Details Patient Name: [name] 08/05/2015 3:15 Date of Service: PM Medical Record [id number] Number: Patient Account Number: [id number] 07-20-1915 (81 y.o. Treating RN: Curtis Sites Date of Birth/Sex: Female) Other Clinician: Primary Care Physician: Donnamarie Rossetti, Wilkie Zenon Referring Physician: Audree Bane Physician/Extender: Weeks in Treatment: 3 History of Present Illness Location: bilateral heel discoloration and ulceration Quality: Patient reports No Pain. Severity: Patient states wound are getting worse. Duration: Patient has had the wound for > 3 months prior to seeking treatment at the wound center Context: The wound appeared gradually over time Modifying Factors: patient  is bedridden and stays in bed for most of the day Associated Signs and Symptoms: Wound has significant periowound erythema and localized edema HPI Description: 81 year old patient who has dementia and comes from [facility] nursing home where she has been noted to have bilateral heel discolorations and ulcerations for several months. The patient's son who has the power of attorney is not available and does not have much to do with his mother. Family friend Delorise Shiner has come along with the patient and she knows the patient for several years and is a Research officer, political party. I understand that the patient has been a DO NOT RESUSCITATE and no aggressive therapy was desired. 07/29/2015 -- the son is still not come to sign for her care and we do not have a power of attorney. Her friend Delorise Shiner is at the bedside. Electronic Signature(s) Signed: 08/05/2015 5:36:04 PM By: Ardath Sax MD Entered By: Ardath Sax on 08/05/2015 17:20:55 [name] ([id number]) -------------------------------------------------------------------------------- Physical Exam Details Patient Name: [name]  08/05/2015 3:15 Date of Service: PM Medical Record [id number] Number: Patient Account Number: [id number] 06-25-1915 (81 y.o. Treating RN: Curtis Sites Date of Birth/Sex: Female) Other Clinician: Primary Care Physician: Amedeo Kinsman Referring Physician: Audree Bane Physician/Extender: Tania Ade in Treatment: 3 Electronic Signature(s) Signed: 08/05/2015 5:36:04 PM By: Ardath Sax MD Entered By: Ardath Sax on 08/05/2015 17:21:01 [name] ([id number]) -------------------------------------------------------------------------------- Physician Orders Details Patient Name: [name] 08/05/2015 3:15 Date of Service: PM Medical Record [id number] Number: Patient Account Number: [id number] Jun 08, 1915 (81 y.o. Treating RN: Curtis Sites Date of Birth/Sex: Female) Other Clinician: Primary Care Physician: Donnamarie Rossetti, Quan Cybulski Referring Physician: Audree Bane Physician/Extender: Tania Ade in Treatment: 3 Verbal / Phone Orders: Yes Clinician: Curtis Sites Read Back and Verified: Yes Diagnosis Coding Wound Cleansing Wound #1 Right Calcaneous o Clean wound with Normal Saline. o Cleanse wound with mild soap and water Wound #2 Left Calcaneous o Clean wound with Normal Saline. o Cleanse wound with mild soap and water Anesthetic Wound #1 Right Calcaneous o Topical Lidocaine 4% cream applied to wound bed prior to debridement Wound #2 Left Calcaneous o Topical Lidocaine 4% cream applied to wound bed prior to debridement Primary Wound Dressing Wound #1 Right Calcaneous o Santyl Ointment Wound #2 Left Calcaneous o Santyl Ointment Secondary Dressing Wound #1 Right Calcaneous o ABD and Kerlix/Conform Wound #2 Left Calcaneous o ABD and Kerlix/Conform Dressing Change Frequency Wound #1 Right Calcaneous o Change dressing every day. [name], [name] ([id number]) Wound #2 Left Calcaneous o Change dressing  every day. Follow-up Appointments Wound #1 Right Calcaneous o Return Appointment in 1 week. Wound #2 Left Calcaneous o Return Appointment in 1 week. Off-Loading Wound #1 Right Calcaneous o Heel suspension boot to: - float heels when in the bed, no pressure on heels when sitting up Wound #2 Left Calcaneous o Heel suspension boot to: - float heels when in the bed, no pressure on heels when sitting up Additional Orders / Instructions Wound #1 Right Calcaneous o Increase protein intake. o Other: - Daily Multivitamin Wound #2 Left Calcaneous o Increase protein intake. o Other: - Daily Multivitamin Electronic Signature(s) Signed: 08/06/2015 10:02:01 AM By: Ardath Sax MD Previous Signature: 08/05/2015 5:24:25 PM Version By: Curtis Sites Entered By: Ardath Sax on 08/06/2015 10:02:01 [name] ([id number]) -------------------------------------------------------------------------------- Problem List Details Patient Name: [name] 08/05/2015 3:15 Date of Service: PM Medical Record [id number] Number: Patient Account Number: [id number] 08-17-1915 (81 y.o. Treating RN: Curtis Sites Date of Birth/Sex: Female)  Other Clinician: Primary Care Physician: Amedeo Kinsman Referring Physician: Audree Bane Physician/Extender: Tania Ade in Treatment: 3 Active Problems ICD-10 Encounter Code Description Active Date Diagnosis L89.610 Pressure ulcer of right heel, unstageable 07/15/2015 Yes L89.620 Pressure ulcer of left heel, unstageable 07/15/2015 Yes I70.234 Atherosclerosis of native arteries of right leg with 07/15/2015 Yes ulceration of heel and midfoot I70.244 Atherosclerosis of native arteries of left leg with ulceration 07/15/2015 Yes of heel and midfoot F03.90 Unspecified dementia without behavioral disturbance 07/15/2015 Yes Inactive Problems Resolved Problems Electronic Signature(s) Signed: 08/05/2015 5:36:04 PM By: Ardath Sax MD Entered By: Ardath Sax on 08/05/2015 17:20:32 [name] ([id number]) -------------------------------------------------------------------------------- Progress Note Details Patient Name: [name] 08/05/2015 3:15 Date of Service: PM Medical Record [id number] Number: Patient Account Number: [id number] [birth year] (81 y.o. Treating RN: Curtis Sites Date of Birth/Sex: Female) Other Clinician: Primary Care Physician: Donnamarie Rossetti, Rosabell Geyer Referring Physician: Audree Bane Physician/Extender: Tania Ade in Treatment: 3 Subjective Chief Complaint Information obtained from Caregiver Patient is at the clinic for treatment of an open pressure ulcer. The patient has dementia and comes along with her caregiver does not have the power of attorney but is a close friend and will pressure. She says she has had changes of both heels for several months now. History of Present Illness (HPI) The following HPI elements were documented for the patient's wound: Location: bilateral heel discoloration and ulceration Quality: Patient reports No Pain. Severity: Patient states wound are getting worse. Duration: Patient has had the wound for > 3 months prior to seeking treatment at the wound center Context: The wound appeared gradually over time Modifying Factors: patient is bedridden and stays in bed for most of the day Associated Signs and Symptoms: Wound has significant periowound erythema and localized edema 81 year old patient who has dementia and comes from [facility] nursing home where she has been noted to have bilateral heel discolorations and ulcerations for several months. The patient's son who has the power of attorney is not available and does not have much to do with his mother. Family friend Delorise Shiner has come along with the patient and she knows the patient for several years and is a Research officer, political party. I understand that the patient has been a DO NOT  RESUSCITATE and no aggressive therapy was desired. 07/29/2015 -- the son is still not come to sign for her care and we do not have a power of attorney. Her friend Delorise Shiner is at the bedside. Objective Constitutional Vitals Time Taken: 3:44 PM, Temperature: 97.8 F, Pulse: 69 bpm, Respiratory Rate: 14 breaths/min, Blood Pressure: 129/88 mmHg. [name], [name] ([id number]) Integumentary (Hair, Skin) Wound #1 status is Open. Original cause of wound was Pressure Injury. The wound is located on the Right Calcaneous. The wound measures 2.4cm length x 2.6cm width x 0.1cm depth; 4.901cm^2 area and 0.49cm^3 volume. The wound is limited to skin breakdown. There is no tunneling or undermining noted. There is a none present amount of drainage noted. The wound margin is indistinct and nonvisible. There is no granulation within the wound bed. There is a large (67-100%) amount of necrotic tissue within the wound bed. The periwound skin appearance exhibited: Dry/Scaly, Ecchymosis. The periwound skin appearance did not exhibit: Callus, Crepitus, Excoriation, Fluctuance, Friable, Induration, Localized Edema, Rash, Scarring, Maceration, Moist, Atrophie Blanche, Cyanosis, Hemosiderin Staining, Mottled, Pallor, Rubor, Erythema. Periwound temperature was noted as No Abnormality. Wound #2 status is Open. Original cause of wound was Pressure Injury. The wound is located on the Left Calcaneous.  The wound measures 4.6cm length x 5cm width x 0.1cm depth; 18.064cm^2 area and 1.806cm^3 volume. The wound is limited to skin breakdown. There is no tunneling or undermining noted. There is a none present amount of drainage noted. The wound margin is indistinct and nonvisible. There is no granulation within the wound bed. There is a large (67-100%) amount of necrotic tissue within the wound bed including Eschar. The periwound skin appearance exhibited: Dry/Scaly, Ecchymosis. The periwound skin appearance did not exhibit:  Callus, Crepitus, Excoriation, Fluctuance, Friable, Induration, Localized Edema, Rash, Scarring, Maceration, Moist, Atrophie Blanche, Cyanosis, Hemosiderin Staining, Mottled, Pallor, Rubor, Erythema. Periwound temperature was noted as No Abnormality. Assessment Active Problems ICD-10 L89.610 - Pressure ulcer of right heel, unstageable L89.620 - Pressure ulcer of left heel, unstageable I70.234 - Atherosclerosis of native arteries of right leg with ulceration of heel and midfoot I70.244 - Atherosclerosis of native arteries of left leg with ulceration of heel and midfoot F03.90 - Unspecified dementia without behavioral disturbance Procedures Wound #1 Wound #1 is a Pressure Ulcer located on the Right Calcaneous . There was a Skin/Subcutaneous Tissue Debridement ([id number]) debridement with total area of 6.24 sq cm performed by Ardath Sax, MD. with the following instrument(s): Blade and Forceps to remove Non-Viable tissue/material including Fibrin/Slough, Eschar, Skin, and Subcutaneous after achieving pain control using Lidocaine 4% Topical Solution. A time out was conducted prior to the start of the procedure. A Minimum amount of bleeding was [name], [name] ([id number]) controlled with Pressure. The procedure was tolerated well with a pain level of 0 throughout and a pain level of 0 following the procedure. Post Debridement Measurements: 2.4cm length x 2.6cm width x 0.1cm depth; 0.49cm^3 volume. Post debridement Stage noted as Category/Stage III. Post procedure Diagnosis Wound #1: Same as Pre-Procedure Wound #2 Wound #2 is a Pressure Ulcer located on the Left Calcaneous . There was a Skin/Subcutaneous Tissue Debridement ([id number]) debridement with total area of 23 sq cm performed by Ardath Sax, MD. with the following instrument(s): Forceps and Scissors to remove Non-Viable tissue/material including Fibrin/Slough, Eschar, Skin, and Subcutaneous after achieving pain control using  Lidocaine 4% Topical Solution. A time out was conducted prior to the start of the procedure. There was no bleeding. The procedure was tolerated well with a pain level of 0 throughout and a pain level of 0 following the procedure. Post Debridement Measurements: 4.6cm length x 5cm width x 0.1cm depth; 1.806cm^3 volume. Post debridement Stage noted as Unstageable/Unclassified. Post procedure Diagnosis Wound #2: Same as Pre-Procedure Plan Wound Cleansing: Wound #1 Right Calcaneous: Clean wound with Normal Saline. Cleanse wound with mild soap and water Wound #2 Left Calcaneous: Clean wound with Normal Saline. Cleanse wound with mild soap and water Anesthetic: Wound #1 Right Calcaneous: Topical Lidocaine 4% cream applied to wound bed prior to debridement Wound #2 Left Calcaneous: Topical Lidocaine 4% cream applied to wound bed prior to debridement Primary Wound Dressing: Wound #1 Right Calcaneous: Santyl Ointment Wound #2 Left Calcaneous: Santyl Ointment Secondary Dressing: Wound #1 Right Calcaneous: ABD and Kerlix/Conform Wound #2 Left Calcaneous: ABD and Kerlix/Conform Dressing Change Frequency: Wound #1 Right Calcaneous: Change dressing every day. Wound #2 Left Calcaneous: [name], [name] ([id number]) Change dressing every day. Follow-up Appointments: Wound #1 Right Calcaneous: Return Appointment in 1 week. Wound #2 Left Calcaneous: Return Appointment in 1 week. Off-Loading: Wound #1 Right Calcaneous: Heel suspension boot to: - float heels when in the bed, no pressure on heels when sitting up Wound #2 Left Calcaneous: Heel suspension boot to: -  float heels when in the bed, no pressure on heels when sitting up Additional Orders / Instructions: Wound #1 Right Calcaneous: Increase protein intake. Other: - Daily Multivitamin Wound #2 Left Calcaneous: Increase protein intake. Other: - Daily Multivitamin Follow-Up Appointments: A follow-up appointment should be  scheduled. A Patient Clinical Summary of Care was provided to DR Debrided necrotic skin from both heels. About 3-4 cm diameter Dressed with siver collagen and Santyl. Electronic Signature(s) Signed: 08/05/2015 5:36:04 PM By: Ardath Sax MD Entered By: Ardath Sax on 08/05/2015 17:22:57 [name] ([id number]) -------------------------------------------------------------------------------- SuperBill Details Patient Name: [name] Date of Service: 08/05/2015 Medical Record Number: [id number] Patient Account Number: [id number] Date of Birth/Sex: 03/15/1915 (81 y.o. Female) Treating RN: Curtis Sites Primary Care Physician: Audree Bane Other Clinician: Referring Physician: Audree Bane Treating Physician/Extender: Elayne Snare in Treatment: 3 Diagnosis Coding ICD-10 Codes Code Description L89.610 Pressure ulcer of right heel, unstageable L89.620 Pressure ulcer of left heel, unstageable I70.234 Atherosclerosis of native arteries of right leg with ulceration of heel and midfoot I70.244 Atherosclerosis of native arteries of left leg with ulceration of heel and midfoot F03.90 Unspecified dementia without behavioral disturbance Facility Procedures CPT4: Description Modifier Quantity Code 62952841 11042 - DEB SUBQ TISSUE 20 SQ CM/< 1 ICD-10 Description Diagnosis F03.90 Unspecified dementia without behavioral disturbance CPT4: 32440102 11045 - DEB SUBQ TISS EA ADDL 20CM 1 ICD-10 Description Diagnosis I70.234 Atherosclerosis of native arteries of right leg with ulceration of heel and midfoot Physician Procedures CPT4: Description Modifier Quantity Code [id number] 99213 - WC PHYS LEVEL 3 - EST PT 1 ICD-10 Description Diagnosis L89.610 Pressure ulcer of right heel, unstageable L89.620 Pressure ulcer of left heel, unstageable CPT4: 4034742 11042 - WC PHYS SUBQ TISS 20 SQ CM 1 ICD-10 Description Diagnosis [name], [name] ([id number]) Electronic Signature(s) Signed:  08/05/2015 5:36:04 PM By: Ardath Sax MD Entered By: Ardath Sax on 08/05/2015 17:23:43

## 2015-08-06 NOTE — Progress Notes (Signed)
[name], [name] ([id number]) Visit Report for 08/05/2015 Arrival Information Details Patient Name: [name], [name] Date of Service: 08/05/2015 3:15 PM Medical Record Number: [id number] Patient Account Number: [id number] Date of Birth/Sex: [birth year] (81 y.o. Female) Treating RN: Curtis Sites Primary Care Physician: Audree Bane Other Clinician: Referring Physician: Audree Bane Treating Physician/Extender: Elayne Snare in Treatment: 3 Visit Information History Since Last Visit Added or deleted any medications: No Patient Arrived: Wheel Chair Any new allergies or adverse reactions: No Arrival Time: 15:40 Had a fall or experienced change in No Accompanied By: friend activities of daily living that may affect Transfer Assistance: Manual risk of falls: Patient Identification Verified: Yes Signs or symptoms of abuse/neglect since last No Secondary Verification Process Yes visito Completed: Hospitalized since last visit: No Patient Has Alerts: Yes Pain Present Now: No Patient Alerts: Patient on Blood Thinner ASA 81 mg NO BP (R) arm Electronic Signature(s) Signed: 08/05/2015 5:24:25 PM By: Curtis Sites Entered By: Curtis Sites on 08/05/2015 15:44:53 Altschuler, Deno Etienne ([id number]) -------------------------------------------------------------------------------- Encounter Discharge Information Details Patient Name: [name] Date of Service: 08/05/2015 3:15 PM Medical Record Number: [id number] Patient Account Number: [id number] Date of Birth/Sex: [birth year] (81 y.o. Female) Treating RN: Curtis Sites Primary Care Physician: Audree Bane Other Clinician: Referring Physician: Audree Bane Treating Physician/Extender: Elayne Snare in Treatment: 3 Encounter Discharge Information Items Discharge Pain Level: 0 Discharge Condition: Stable Ambulatory Status: Wheelchair Discharge Destination: Nursing Home Transportation: Private Auto Accompanied By:  friend and son Schedule Follow-up Appointment: Yes Medication Reconciliation completed and provided to Patient/Care No Keifer Habib: Provided on Clinical Summary of Care: 08/05/2015 Form Type Recipient Paper Patient DR Electronic Signature(s) Signed: 08/05/2015 5:36:04 PM By: Ardath Sax MD Previous Signature: 08/05/2015 4:17:44 PM Version By: Gwenlyn Perking Entered By: Ardath Sax on 08/05/2015 17:24:01 [name] ([id number]) -------------------------------------------------------------------------------- Lower Extremity Assessment Details Patient Name: [name] Date of Service: 08/05/2015 3:15 PM Medical Record Number: [id number] Patient Account Number: [id number] Date of Birth/Sex: 1915-04-30 (81 y.o. Female) Treating RN: Curtis Sites Primary Care Physician: Audree Bane Other Clinician: Referring Physician: Audree Bane Treating Physician/Extender: Ardath Sax Weeks in Treatment: 3 Edema Assessment Assessed: [Left: No] [Right: No] Edema: [Left: Yes] [Right: Yes] Vascular Assessment Pulses: Posterior Tibial Dorsalis Pedis Palpable: [Left:Yes] [Right:Yes] Extremity colors, hair growth, and conditions: Extremity Color: [Left:Normal] [Right:Normal] Hair Growth on Extremity: [Left:No] [Right:No] Temperature of Extremity: [Left:Cool] [Right:Cool] Capillary Refill: [Left:< 3 seconds] [Right:< 3 seconds] Toe Nail Assessment Left: Right: Thick: Yes Yes Discolored: Yes Yes Deformed: Yes Yes Improper Length and Hygiene: No No Electronic Signature(s) Signed: 08/05/2015 5:24:25 PM By: Curtis Sites Entered By: Curtis Sites on 08/05/2015 15:52:19 Zanella, Deno Etienne ([id number]) -------------------------------------------------------------------------------- Multi Wound Chart Details Patient Name: [name] Date of Service: 08/05/2015 3:15 PM Medical Record Number: [id number] Patient Account Number: [id number] Date of Birth/Sex: 1915/10/23 (81  y.o. Female) Treating RN: Curtis Sites Primary Care Physician: Audree Bane Other Clinician: Referring Physician: Audree Bane Treating Physician/Extender: Ardath Sax Weeks in Treatment: 3 Vital Signs Height(in): Pulse(bpm): 69 Weight(lbs): Blood Pressure 129/88 (mmHg): Body Mass Index(BMI): Temperature(F): 97.8 Respiratory Rate 14 (breaths/min): Photos: [1:No Photos] [2:No Photos] [N/A:N/A] Wound Location: [1:Right Calcaneous] [2:Left Calcaneous] [N/A:N/A] Wounding Event: [1:Pressure Injury] [2:Pressure Injury] [N/A:N/A] Primary Etiology: [1:Pressure Ulcer] [2:Pressure Ulcer] [N/A:N/A] Date Acquired: [1:12/25/2014] [2:02/24/2015] [N/A:N/A] Weeks of Treatment: [1:3] [2:3] [N/A:N/A] Wound Status: [1:Open] [2:Open] [N/A:N/A] Measurements L x W x D 2.4x2.6x0.1 [2:4.6x5x0.1] [N/A:N/A] (cm) Area (cm) : [1:4.901] [2:18.064] [N/A:N/A] Volume (cm) : [1:0.49] [2:1.806] [N/A:N/A] % Reduction in Area: [1:50.50%] [  2:-52.10%] [N/A:N/A] % Reduction in Volume: 50.50% [2:49.30%] [N/A:N/A] Classification: [1:Category/Stage III] [2:Unstageable/Unclassified] [N/A:N/A] Periwound Skin Texture: No Abnormalities Noted [2:No Abnormalities Noted] [N/A:N/A] Periwound Skin [1:No Abnormalities Noted] [2:No Abnormalities Noted] [N/A:N/A] Moisture: Periwound Skin Color: No Abnormalities Noted [2:No Abnormalities Noted] [N/A:N/A] Tenderness on [1:No] [2:No] [N/A:N/A] Treatment Notes Electronic Signature(s) Signed: 08/05/2015 5:24:25 PM By: Curtis Sites Entered By: Curtis Sites on 08/05/2015 15:54:09 Gause, Deno Etienne ([id number]) -------------------------------------------------------------------------------- Multi-Disciplinary Care Plan Details Patient Name: [name] Date of Service: 08/05/2015 3:15 PM Medical Record Number: [id number] Patient Account Number: [id number] Date of Birth/Sex: 13-Mar-1915 (81 y.o. Female) Treating RN: Curtis Sites Primary Care Physician: Audree Bane Other Clinician: Referring Physician: Audree Bane Treating Physician/Extender: Elayne Snare in Treatment: 3 Active Inactive Abuse / Safety / Falls / Self Care Management Nursing Diagnoses: Potential for falls Goals: Patient will remain injury free Date Initiated: 07/15/2015 Goal Status: Active Interventions: Assess fall risk on admission and as needed Notes: Nutrition Nursing Diagnoses: Potential for alteratiion in Nutrition/Potential for imbalanced nutrition Goals: Patient/caregiver agrees to and verbalizes understanding of need to use nutritional supplements and/or vitamins as prescribed Date Initiated: 07/15/2015 Goal Status: Active Interventions: Assess patient nutrition upon admission and as needed per policy Notes: Orientation to the Wound Care Program Nursing Diagnoses: Knowledge deficit related to the wound healing center program Goals: Patient/caregiver will verbalize understanding of the W[facility]aling Center [address] [place], [place] ([id number]) Date Initiated: 07/15/2015 Goal Status: Active Interventions: Provide education on orientation to the wound center Notes: Pressure Nursing Diagnoses: Knowledge deficit related to management of pressures ulcers Goals: Patient will remain free from development of additional pressure ulcers Date Initiated: 07/15/2015 Goal Status: Active Interventions: Assess: immobility, friction, shearing, incontinence upon admission and as needed Provide education on pressure ulcers Notes: Wound/Skin Impairment Nursing Diagnoses: Impaired tissue integrity Goals: Patient/caregiver will verbalize understanding of skin care regimen Date Initiated: 07/15/2015 Goal Status: Active Interventions: Assess patient/caregiver ability to obtain necessary supplies Treatment Activities: Topical wound management initiated : 08/05/2015 Notes: Electronic Signature(s) Signed: 08/05/2015 5:24:25 PM By: Curtis Sites Entered By:  Curtis Sites on 08/05/2015 15:53:59 Hunsberger, Deno Etienne ([id number]) -------------------------------------------------------------------------------- Patient/Caregiver Education Details Patient Name: [name] Date of Service: 08/05/2015 3:15 PM Medical Record Number: [id number] Patient Account Number: [id number] Date of Birth/Gender: Jan 28, 1915 (81 y.o. Female) Treating RN: Curtis Sites Primary Care Physician: Audree Bane Other Clinician: Referring Physician: Audree Bane Treating Physician/Extender: Elayne Snare in Treatment: 3 Education Assessment Education Provided To: Caregiver Education Topics Provided Offloading: Handouts: Other: offloading Methods: Demonstration, Explain/Verbal Responses: State content correctly Electronic Signature(s) Signed: 08/05/2015 5:36:04 PM By: Ardath Sax MD Entered By: Ardath Sax on 08/05/2015 17:24:06 [name] ([id number]) -------------------------------------------------------------------------------- Wound Assessment Details Patient Name: [name] Date of Service: 08/05/2015 3:15 PM Medical Record Number: [id number] Patient Account Number: [id number] Date of Birth/Sex: Mar 02, 1915 (81 y.o. Female) Treating RN: Curtis Sites Primary Care Physician: Audree Bane Other Clinician: Referring Physician: Audree Bane Treating Physician/Extender: Ardath Sax Weeks in Treatment: 3 Wound Status Wound Number: 1 Primary Pressure Ulcer Etiology: Wound Location: Right Calcaneous Wound Open Wounding Event: Pressure Injury Status: Date Acquired: 12/25/2014 Comorbid Anemia, Arrhythmia, Congestive Heart Weeks Of Treatment: 3 History: Failure, Coronary Artery Disease, Deep Clustered Wound: No Vein Thrombosis, Hypertension, End Stage Renal Disease, Osteoarthritis, Dementia Photos Photo Uploaded By: Curtis Sites on 08/05/2015 16:33:05 Wound Measurements Length: (cm) 2.4 % Reduction in Width: (cm) 2.6 %  Reduction in Depth: (cm) 0.1 Epithelializati Area: (cm) 4.901 Tunneling: Volume: (cm) 0.49 Undermining: Area: 50.5% Volume: 50.5%  on: None No No Wound Description Classification: Category/Stage III Wound Margin: Indistinct, nonvisible Exudate Amount: None Present Foul Odor After Cleansing: No Wound Bed Granulation Amount: None Present (0%) Exposed Structure Necrotic Amount: Large (67-100%) Fascia Exposed: No Fat Layer Exposed: No Tendon Exposed: No Sandell, Valkyrie S. ([id number]) Muscle Exposed: No Joint Exposed: No Bone Exposed: No Limited to Skin Breakdown Periwound Skin Texture Texture Color No Abnormalities Noted: No No Abnormalities Noted: No Callus: No Atrophie Blanche: No Crepitus: No Cyanosis: No Excoriation: No Ecchymosis: Yes Fluctuance: No Erythema: No Friable: No Hemosiderin Staining: No Induration: No Mottled: No Localized Edema: No Pallor: No Rash: No Rubor: No Scarring: No Temperature / Pain Moisture Temperature: No Abnormality No Abnormalities Noted: No Dry / Scaly: Yes Maceration: No Moist: No Wound Preparation Ulcer Cleansing: Rinsed/Irrigated with Saline Topical Anesthetic Applied: None Treatment Notes Wound #1 (Right Calcaneous) 1. Cleansed with: Clean wound with Normal Saline 4. Dressing Applied: Santyl Ointment 5. Secondary Dressing Applied Guaze, ABD and kerlix/Conform 7. Secured with Secretary/administrator) Signed: 08/05/2015 5:24:25 PM By: Curtis Sites Entered By: Curtis Sites on 08/05/2015 16:18:02 [name] ([id number]) -------------------------------------------------------------------------------- Wound Assessment Details Patient Name: [name] Date of Service: 08/05/2015 3:15 PM Medical Record Number: [id number] Patient Account Number: [id number] Date of Birth/Sex: Dec 31, 1914 (81 y.o. Female) Treating RN: Curtis Sites Primary Care Physician: Audree Bane Other  Clinician: Referring Physician: Audree Bane Treating Physician/Extender: Ardath Sax Weeks in Treatment: 3 Wound Status Wound Number: 2 Primary Pressure Ulcer Etiology: Wound Location: Left Calcaneous Wound Open Wounding Event: Pressure Injury Status: Date Acquired: 02/24/2015 Comorbid Anemia, Arrhythmia, Congestive Heart Weeks Of Treatment: 3 History: Failure, Coronary Artery Disease, Deep Clustered Wound: No Vein Thrombosis, Hypertension, End Stage Renal Disease, Osteoarthritis, Dementia Photos Photo Uploaded By: Curtis Sites on 08/05/2015 16:32:03 Wound Measurements Length: (cm) 4.6 % Reduction in Width: (cm) 5 % Reduction in Depth: (cm) 0.1 Epithelializati Area: (cm) 18.064 Tunneling: Volume: (cm) 1.806 Undermining: Area: -52.1% Volume: 49.3% on: None No No Wound Description Classification: Unstageable/Unclassified Wound Margin: Indistinct, nonvisible Exudate Amount: None Present Foul Odor After Cleansing: No Wound Bed Granulation Amount: None Present (0%) Exposed Structure Necrotic Amount: Large (67-100%) Fascia Exposed: No Necrotic Quality: Eschar Fat Layer Exposed: No Tendon Exposed: No Krueger, Eryca S. ([id number]) Muscle Exposed: No Joint Exposed: No Bone Exposed: No Limited to Skin Breakdown Periwound Skin Texture Texture Color No Abnormalities Noted: No No Abnormalities Noted: No Callus: No Atrophie Blanche: No Crepitus: No Cyanosis: No Excoriation: No Ecchymosis: Yes Fluctuance: No Erythema: No Friable: No Hemosiderin Staining: No Induration: No Mottled: No Localized Edema: No Pallor: No Rash: No Rubor: No Scarring: No Temperature / Pain Moisture Temperature: No Abnormality No Abnormalities Noted: No Dry / Scaly: Yes Maceration: No Moist: No Wound Preparation Ulcer Cleansing: Rinsed/Irrigated with Saline Topical Anesthetic Applied: None Treatment Notes Wound #2 (Left Calcaneous) 1. Cleansed with: Clean wound with  Normal Saline 4. Dressing Applied: Santyl Ointment 5. Secondary Dressing Applied Guaze, ABD and kerlix/Conform 7. Secured with Secretary/administrator) Signed: 08/05/2015 5:24:25 PM By: Curtis Sites Entered By: Curtis Sites on 08/05/2015 16:18:16 Mahabir, Deno Etienne ([id number]) -------------------------------------------------------------------------------- Vitals Details Patient Name: [name] Date of Service: 08/05/2015 3:15 PM Medical Record Number: [id number] Patient Account Number: [id number] Date of Birth/Sex: April 04, 1915 (81 y.o. Female) Treating RN: Curtis Sites Primary Care Physician: Audree Bane Other Clinician: Referring Physician: Audree Bane Treating Physician/Extender: Ardath Sax Weeks in Treatment: 3 Vital Signs Time Taken: 15:44 Temperature (F): 97.8 Pulse (bpm): 69 Respiratory Rate (breaths/min): 14 Blood  Pressure (mmHg): 129/88 Reference Range: 80 - 120 mg / dl Electronic Signature(s) Signed: 08/05/2015 5:24:25 PM By: Curtis Sites Entered By: Curtis Sites on 08/05/2015 15:46:13

## 2015-08-13 ENCOUNTER — Encounter: Payer: Medicare Other | Admitting: Surgery

## 2015-08-13 DIAGNOSIS — L89610 Pressure ulcer of right heel, unstageable: Secondary | ICD-10-CM | POA: Diagnosis not present

## 2015-08-14 NOTE — Progress Notes (Signed)
[name], Sharday S. ([id number]) Visit Report for 08/13/2015 Chief Complaint Document Details Patient Name: [name], [name] 08/13/2015 2:15 Date of Service: PM Medical Record [id number] Number: Patient Account Number: [id number] 02-13-1915 (81 y.o. Treating RN: Curtis Sitesorthy, Joanna Date of Birth/Sex: Female) Other Clinician: Primary Care Physician: Audree BaneKING, PETER Treating Evlyn KannerBritto, Kurk Corniel Referring Physician: Audree BaneKING, PETER Physician/Extender: Tania AdeWeeks in Treatment: 4 Information Obtained from: Caregiver Chief Complaint Patient is at the clinic for treatment of an open pressure ulcer. The patient has dementia and comes along with her caregiver does not have the power of attorney but is a close friend and will pressure. She says she has had changes of both heels for several months now. Electronic Signature(s) Signed: 08/13/2015 3:22:07 PM By: Evlyn KannerBritto, Ebelyn Bohnet MD, FACS Entered By: Evlyn KannerBritto, Nature Vogelsang on 08/13/2015 15:22:07 Kathy CancelASCOE, Kendy S. ([id number]) -------------------------------------------------------------------------------- Debridement Details Patient Name: Kathy CancelASCOE, [name] 08/13/2015 2:15 Date of Service: PM Medical Record [id number] Number: Patient Account Number: [id number] 02-13-1915 (81 y.o. Treating RN: Curtis Sitesorthy, Joanna Date of Birth/Sex: Female) Other Clinician: Primary Care Physician: Audree BaneKING, PETER Treating Aarin Sparkman Referring Physician: Audree BaneKING, PETER Physician/Extender: Tania AdeWeeks in Treatment: 4 Debridement Performed for Wound #2 Left Calcaneous Assessment: Performed By: Physician Evlyn KannerBritto, Lanette Ell, MD Debridement: Debridement Pre-procedure Yes Verification/Time Out Taken: Start Time: 14:52 Pain Control: Lidocaine 4% Topical Solution Level: Skin/Subcutaneous Tissue Total Area Debrided (L x 2.2 (cm) x 3.5 (cm) = 7.7 (cm) W): Tissue and other Viable, Non-Viable, Eschar, Fibrin/Slough, Subcutaneous material debrided: Instrument: Forceps, Scissors Bleeding: Minimum Hemostasis  Achieved: Pressure End Time: 14:56 Procedural Pain: 0 Post Procedural Pain: 0 Response to Treatment: Procedure was tolerated well Post Debridement Measurements of Total Wound Length: (cm) 2.2 Stage: Unstageable/Unclassified Width: (cm) 3.5 Depth: (cm) 0.4 Volume: (cm) 2.419 Post Procedure Diagnosis Same as Pre-procedure Electronic Signature(s) Signed: 08/13/2015 3:21:51 PM By: Evlyn KannerBritto, Willy Pinkerton MD, FACS Signed: 08/13/2015 5:20:05 PM By: Curtis Sitesorthy, Joanna Entered By: Evlyn KannerBritto, Noah Lembke on 08/13/2015 15:21:51 Strickland, Deno [name] ([id number]) -------------------------------------------------------------------------------- HPI Details Patient Name: Kathy CancelASCOE, [name] 08/13/2015 2:15 Date of Service: PM Medical Record [id number] Number: Patient Account Number: [id number] 02-13-1915 (81 y.o. Treating RN: Curtis Sitesorthy, Joanna Date of Birth/Sex: Female) Other Clinician: Primary Care Physician: Audree BaneKING, PETER Treating Evlyn KannerBritto, [name] Referring Physician: Audree BaneKING, PETER Physician/Extender: Tania AdeWeeks in Treatment: 4 History of Present Illness Location: bilateral heel discoloration and ulceration Quality: Patient reports No Pain. Severity: Patient states wound are getting worse. Duration: Patient has had the wound for > 3 months prior to seeking treatment at the wound center Context: The wound appeared gradually over time Modifying Factors: patient is bedridden and stays in bed for most of the day Associated Signs and Symptoms: Wound has significant periowound erythema and localized edema HPI Description: 81 year old patient who has dementia and comes from [facility] where she has been noted to have bilateral heel discolorations and ulcerations for several months. The patient's son who has the power of attorney is not available and does not have much to do with his mother. Family friend [name] has come along with the patient and she knows the patient for several years and is a Research officer, political [employer]. I  understand that the patient has been a DO NOT RESUSCITATE and no aggressive therapy was desired. 07/29/2015 -- the son is still not come to sign for her care and we do not have a power of attorney. Her friend [name] is at the bedside. 08/13/2015 -- her son was here last week and signed the consent for surgical debridement and she is here today with her friend [name]. Electronic Signature(s) Signed:  08/13/2015 3:22:38 PM By: Evlyn Kanner MD, FACS Entered By: Evlyn Kanner on 08/13/2015 15:22:38 [name] ([id number]) -------------------------------------------------------------------------------- Physical Exam Details Patient Name: [name] 08/13/2015 2:15 Date of Service: PM Medical Record [id number] Number: Patient Account Number: [id number] 1915-07-23 (81 y.o. Treating RN: Curtis Sites Date of Birth/Sex: Female) Other Clinician: Primary Care Physician: Audree Bane Treating Evlyn Kanner Referring Physician: Audree Bane Physician/Extender: Weeks in Treatment: 4 Constitutional . Pulse regular. Respirations normal and unlabored. Afebrile. . Eyes Nonicteric. Reactive to light. Ears, Nose, Mouth, and Throat Lips, teeth, and gums WNL.Marland Kitchen Moist mucosa without lesions . Neck supple and nontender. No palpable supraclavicular or cervical adenopathy. Normal sized without goiter. Respiratory WNL. No retractions.. Cardiovascular Pedal Pulses WNL. No clubbing, cyanosis or edema. Lymphatic No adneopathy. No adenopathy. No adenopathy. Musculoskeletal Adexa without tenderness or enlargement.. Digits and nails w/o clubbing, cyanosis, infection, petechiae, ischemia, or inflammatory conditions.. Integumentary (Hair, Skin) No suspicious lesions. No crepitus or fluctuance. No peri-wound warmth or erythema. No masses.Marland Kitchen Psychiatric Judgement and insight Intact.. No evidence of depression, anxiety, or agitation.. Notes the eschar on the left heel was sharply debrided with  forceps and scissors and the subcutaneous tissue has some debris. Last week the right heel was debrided and there is a lot of slough and debris and we will continue to use Santyl. Electronic Signature(s) Signed: 08/13/2015 3:23:17 PM By: Evlyn Kanner MD, FACS Entered By: Evlyn Kanner on 08/13/2015 15:23:17 [name] ([id number]) -------------------------------------------------------------------------------- Physician Orders Details Patient Name: [name] 08/13/2015 2:15 Date of Service: PM Medical Record [id number] Number: Patient Account Number: [id number] [birth year] (81 y.o. Treating RN: Curtis Sites Date of Birth/Sex: Female) Other Clinician: Primary Care Physician: Audree Bane Treating Evlyn Kanner Referring Physician: Audree Bane Physician/Extender: Tania Ade in Treatment: 4 Verbal / Phone Orders: Yes Clinician: Curtis Sites Read Back and Verified: Yes Diagnosis Coding Wound Cleansing Wound #1 Right Calcaneous o Clean wound with Normal Saline. o Cleanse wound with mild soap and water Wound #2 Left Calcaneous o Clean wound with Normal Saline. o Cleanse wound with mild soap and water Anesthetic Wound #1 Right Calcaneous o Topical Lidocaine 4% cream applied to wound bed prior to debridement Wound #2 Left Calcaneous o Topical Lidocaine 4% cream applied to wound bed prior to debridement Primary Wound Dressing Wound #1 Right Calcaneous o Santyl Ointment Wound #2 Left Calcaneous o Santyl Ointment Secondary Dressing Wound #1 Right Calcaneous o ABD and Kerlix/Conform Wound #2 Left Calcaneous o ABD and Kerlix/Conform Dressing Change Frequency Wound #1 Right Calcaneous o Change dressing every day. KELCIE, CURRIE ([id number]) Wound #2 Left Calcaneous o Change dressing every day. Follow-up Appointments Wound #1 Right Calcaneous o Return Appointment in 1 week. Wound #2 Left Calcaneous o Return Appointment in 1  week. Off-Loading Wound #1 Right Calcaneous o Heel suspension boot to: - float heels when in the bed, no pressure on heels when sitting up Wound #2 Left Calcaneous o Heel suspension boot to: - float heels when in the bed, no pressure on heels when sitting up Additional Orders / Instructions Wound #1 Right Calcaneous o Increase protein intake. o Other: - Daily Multivitamin Wound #2 Left Calcaneous o Increase protein intake. o Other: - Daily Multivitamin Electronic Signature(s) Signed: 08/13/2015 4:16:00 PM By: Evlyn Kanner MD, FACS Signed: 08/13/2015 5:20:05 PM By: Curtis Sites Entered By: Curtis Sites on 08/13/2015 14:57:33 Hank, Deno Etienne ([id number]) -------------------------------------------------------------------------------- Problem List Details Patient Name: JULIANNY, MILSTEIN 08/13/2015 2:15 Date of Service: PM Medical Record [id number] Number: Patient Account Number:  [id number] 02-Jul-1915 (81 y.o. Treating RN: Curtis Sites Date of Birth/Sex: Female) Other Clinician: Primary Care Physician: Audree Bane Treating Evlyn Kanner Referring Physician: Audree Bane Physician/Extender: Tania Ade in Treatment: 4 Active Problems ICD-10 Encounter Code Description Active Date Diagnosis L89.610 Pressure ulcer of right heel, unstageable 07/15/2015 Yes L89.620 Pressure ulcer of left heel, unstageable 07/15/2015 Yes I70.234 Atherosclerosis of native arteries of right leg with 07/15/2015 Yes ulceration of heel and midfoot I70.244 Atherosclerosis of native arteries of left leg with ulceration 07/15/2015 Yes of heel and midfoot F03.90 Unspecified dementia without behavioral disturbance 07/15/2015 Yes Inactive Problems Resolved Problems Electronic Signature(s) Signed: 08/13/2015 3:21:40 PM By: Evlyn Kanner MD, FACS Entered By: Evlyn Kanner on 08/13/2015 15:21:40 [name]  ([id number]) -------------------------------------------------------------------------------- Progress Note Details Patient Name: [name] 08/13/2015 2:15 Date of Service: PM Medical Record [id number] Number: Patient Account Number: [id number] 1915/06/11 (81 y.o. Treating RN: Curtis Sites Date of Birth/Sex: Female) Other Clinician: Primary Care Physician: Audree Bane Treating Evlyn Kanner Referring Physician: Audree Bane Physician/Extender: Tania Ade in Treatment: 4 Subjective Chief Complaint Information obtained from Caregiver Patient is at the clinic for treatment of an open pressure ulcer. The patient has dementia and comes along with her caregiver does not have the power of attorney but is a close friend and will pressure. She says she has had changes of both heels for several months now. History of Present Illness (HPI) The following HPI elements were documented for the patient's wound: Location: bilateral heel discoloration and ulceration Quality: Patient reports No Pain. Severity: Patient states wound are getting worse. Duration: Patient has had the wound for > 3 months prior to seeking treatment at the wound center Context: The wound appeared gradually over time Modifying Factors: patient is bedridden and stays in bed for most of the day Associated Signs and Symptoms: Wound has significant periowound erythema and localized edema 81 year old patient who has dementia and comes from [facility] nursing home where she has been noted to have bilateral heel discolorations and ulcerations for several months. The patient's son who has the power of attorney is not available and does not have much to do with his mother. Family friend Kathy Shiner has come along with the patient and she knows the patient for several years and is a Research officer, political party. I understand that the patient has been a DO NOT RESUSCITATE and no aggressive therapy was desired. 07/29/2015 -- the son is still not  come to sign for her care and we do not have a power of attorney. Her friend Kathy Shiner is at the bedside. 08/13/2015 -- her son was here last week and signed the consent for surgical debridement and she is here today with her friend Kathy Shiner. Objective Constitutional Searls, Pamala S. ([id number]) Pulse regular. Respirations normal and unlabored. Afebrile. Vitals Time Taken: 2:28 PM, Pulse: 89 bpm, Respiratory Rate: 16 breaths/min, Blood Pressure: 133/71 mmHg. Eyes Nonicteric. Reactive to light. Ears, Nose, Mouth, and Throat Lips, teeth, and gums WNL.Marland Kitchen Moist mucosa without lesions . Neck supple and nontender. No palpable supraclavicular or cervical adenopathy. Normal sized without goiter. Respiratory WNL. No retractions.. Cardiovascular Pedal Pulses WNL. No clubbing, cyanosis or edema. Lymphatic No adneopathy. No adenopathy. No adenopathy. Musculoskeletal Adexa without tenderness or enlargement.. Digits and nails w/o clubbing, cyanosis, infection, petechiae, ischemia, or inflammatory conditions.Marland Kitchen Psychiatric Judgement and insight Intact.. No evidence of depression, anxiety, or agitation.. General Notes: the eschar on the left heel was sharply debrided with forceps and scissors and the subcutaneous tissue has some debris. Last week the right heel  was debrided and there is a lot of slough and debris and we will continue to use Santyl. Integumentary (Hair, Skin) No suspicious lesions. No crepitus or fluctuance. No peri-wound warmth or erythema. No masses.. Wound #1 status is Open. Original cause of wound was Pressure Injury. The wound is located on the Right Calcaneous. The wound measures 2.5cm length x 2.4cm width x 0.3cm depth; 4.712cm^2 area and 1.414cm^3 volume. The wound is limited to skin breakdown. There is no tunneling or undermining noted. There is a medium amount of serous drainage noted. The wound margin is indistinct and nonvisible. There is no granulation within the wound bed.  There is a large (67-100%) amount of necrotic tissue within the wound bed. The periwound skin appearance exhibited: Dry/Scaly, Ecchymosis. The periwound skin appearance did not exhibit: Callus, Crepitus, Excoriation, Fluctuance, Friable, Induration, Localized Edema, Rash, Scarring, Maceration, Moist, Atrophie Blanche, Cyanosis, Hemosiderin Staining, Mottled, Pallor, Rubor, Erythema. Periwound temperature was noted as No Abnormality. Wound #2 status is Open. Original cause of wound was Pressure Injury. The wound is located on the Left Calcaneous. The wound measures 2.2cm length x 3.5cm width x 0.2cm depth; 6.048cm^2 area and Masser, Elvenia S. ([id number]) 1.21cm^3 volume. The wound is limited to skin breakdown. There is no tunneling or undermining noted. There is a small amount of serous drainage noted. The wound margin is indistinct and nonvisible. There is no granulation within the wound bed. There is a large (67-100%) amount of necrotic tissue within the wound bed including Eschar. The periwound skin appearance exhibited: Dry/Scaly, Ecchymosis. The periwound skin appearance did not exhibit: Callus, Crepitus, Excoriation, Fluctuance, Friable, Induration, Localized Edema, Rash, Scarring, Maceration, Moist, Atrophie Blanche, Cyanosis, Hemosiderin Staining, Mottled, Pallor, Rubor, Erythema. Periwound temperature was noted as No Abnormality. Assessment Active Problems ICD-10 L89.610 - Pressure ulcer of right heel, unstageable L89.620 - Pressure ulcer of left heel, unstageable I70.234 - Atherosclerosis of native arteries of right leg with ulceration of heel and midfoot I70.244 - Atherosclerosis of native arteries of left leg with ulceration of heel and midfoot F03.90 - Unspecified dementia without behavioral disturbance Procedures Wound #2 Wound #2 is a Pressure Ulcer located on the Left Calcaneous . There was a Skin/Subcutaneous Tissue Debridement ([id number]) debridement with total area of  7.7 sq cm performed by Evlyn Kanner, MD. with the following instrument(s): Forceps and Scissors to remove Viable and Non-Viable tissue/material including Fibrin/Slough, Eschar, and Subcutaneous after achieving pain control using Lidocaine 4% Topical Solution. A time out was conducted prior to the start of the procedure. A Minimum amount of bleeding was controlled with Pressure. The procedure was tolerated well with a pain level of 0 throughout and a pain level of 0 following the procedure. Post Debridement Measurements: 2.2cm length x 3.5cm width x 0.4cm depth; 2.419cm^3 volume. Post debridement Stage noted as Unstageable/Unclassified. Post procedure Diagnosis Wound #2: Same as Pre-Procedure Plan Wound Cleansing: Wound #1 Right Calcaneous: Klink, Donie S. ([id number]) Clean wound with Normal Saline. Cleanse wound with mild soap and water Wound #2 Left Calcaneous: Clean wound with Normal Saline. Cleanse wound with mild soap and water Anesthetic: Wound #1 Right Calcaneous: Topical Lidocaine 4% cream applied to wound bed prior to debridement Wound #2 Left Calcaneous: Topical Lidocaine 4% cream applied to wound bed prior to debridement Primary Wound Dressing: Wound #1 Right Calcaneous: Santyl Ointment Wound #2 Left Calcaneous: Santyl Ointment Secondary Dressing: Wound #1 Right Calcaneous: ABD and Kerlix/Conform Wound #2 Left Calcaneous: ABD and Kerlix/Conform Dressing Change Frequency: Wound #1 Right Calcaneous: Change dressing every  day. Wound #2 Left Calcaneous: Change dressing every day. Follow-up Appointments: Wound #1 Right Calcaneous: Return Appointment in 1 week. Wound #2 Left Calcaneous: Return Appointment in 1 week. Off-Loading: Wound #1 Right Calcaneous: Heel suspension boot to: - float heels when in the bed, no pressure on heels when sitting up Wound #2 Left Calcaneous: Heel suspension boot to: - float heels when in the bed, no pressure on heels when sitting  up Additional Orders / Instructions: Wound #1 Right Calcaneous: Increase protein intake. Other: - Daily Multivitamin Wound #2 Left Calcaneous: Increase protein intake. Other: - Daily Multivitamin I recommended Santyl to both heels with heel protection and continue to offload as much as possible. EMMERIE, BATTAGLIA ([id number]) We will see her back next week. Electronic Signature(s) Signed: 08/13/2015 3:23:47 PM By: Evlyn Kanner MD, FACS Entered By: Evlyn Kanner on 08/13/2015 15:23:47 [name] ([id number]) -------------------------------------------------------------------------------- SuperBill Details Patient Name: [name] Date of Service: 08/13/2015 Medical Record Number: [id number] Patient Account Number: [id number] Date of Birth/Sex: 04-29-1915 (81 y.o. Female) Treating RN: Curtis Sites Primary Care Physician: Audree Bane Other Clinician: Referring Physician: Audree Bane Treating Physician/Extender: Rudene Re in Treatment: 4 Diagnosis Coding ICD-10 Codes Code Description L89.610 Pressure ulcer of right heel, unstageable L89.620 Pressure ulcer of left heel, unstageable I70.234 Atherosclerosis of native arteries of right leg with ulceration of heel and midfoot I70.244 Atherosclerosis of native arteries of left leg with ulceration of heel and midfoot F03.90 Unspecified dementia without behavioral disturbance Facility Procedures CPT4: Description Modifier Quantity Code [id number] 11042 - DEB SUBQ TISSUE 20 SQ CM/< 1 ICD-10 Description Diagnosis L89.620 Pressure ulcer of left heel, unstageable I70.244 Atherosclerosis of native arteries of left leg with ulceration of heel and  midfoot F03.90 Unspecified dementia without behavioral disturbance Physician Procedures CPT4: Description Modifier Quantity Code [id number] 11042 - WC PHYS SUBQ TISS 20 SQ CM 1 ICD-10 Description Diagnosis L89.620 Pressure ulcer of left heel, unstageable I70.244 Atherosclerosis of  native arteries of left leg with ulceration of heel and midfoot  F03.90 Unspecified dementia without behavioral disturbance Electronic Signature(s) Signed: 08/13/2015 3:24:04 PM By: Evlyn Kanner MD, FACS Entered By: Evlyn Kanner on 08/13/2015 15:24:04

## 2015-08-14 NOTE — Progress Notes (Signed)
Kathy, [name] ([id number]) Visit Report for 08/13/2015 Arrival Information Details Patient Name: [name], [name] Date of Service: 08/13/2015 2:15 PM Medical Record Number: [id number] Patient Account Number: [id number] Date of Birth/Sex: 1915-01-20 (81 y.o. Female) Treating RN: Curtis Sites Primary Care Physician: Audree Bane Other Clinician: Referring Physician: Audree Bane Treating Physician/Extender: Rudene Re in Treatment: 4 Visit Information History Since Last Visit Added or deleted any medications: No Patient Arrived: Wheel Chair Any new allergies or adverse reactions: No Arrival Time: 14:25 Had a fall or experienced change in No Accompanied By: friend activities of daily living that may affect Transfer Assistance: Manual risk of falls: Patient Identification Verified: Yes Signs or symptoms of abuse/neglect since last No Secondary Verification Process Yes visito Completed: Hospitalized since last visit: No Patient Has Alerts: Yes Pain Present Now: No Patient Alerts: Patient on Blood Thinner ASA 81 mg NO BP (R) arm Electronic Signature(s) Signed: 08/13/2015 5:20:05 PM By: Curtis Sites Entered By: Curtis Sites on 08/13/2015 14:26:20 [name], [name] ([id number]) -------------------------------------------------------------------------------- Encounter Discharge Information Details Patient Name: [name] Date of Service: 08/13/2015 2:15 PM Medical Record Number: [id number] Patient Account Number: [id number] Date of Birth/Sex: January 21, 1915 (81 y.o. Female) Treating RN: Curtis Sites Primary Care Physician: Audree Bane Other Clinician: Referring Physician: Audree Bane Treating Physician/Extender: Rudene Re in Treatment: 4 Encounter Discharge Information Items Discharge Pain Level: 0 Discharge Condition: Stable Ambulatory Status: Wheelchair Discharge Destination: Home Transportation: Private Auto Accompanied By:  friend Schedule Follow-up Appointment: Yes Medication Reconciliation completed and provided to Patient/Care No [name]: Provided on Clinical Summary of Care: 08/13/2015 Form Type Recipient Paper Patient DR Electronic Signature(s) Signed: 08/13/2015 3:20:15 PM By: Gwenlyn Perking Entered By: Gwenlyn Perking on 08/13/2015 15:20:15 [name], [name] ([id number]) -------------------------------------------------------------------------------- Lower Extremity Assessment Details Patient Name: [name] Date of Service: 08/13/2015 2:15 PM Medical Record Number: [id number] Patient Account Number: [id number] Date of Birth/Sex: [birth year] (81 y.o. Female) Treating RN: Curtis Sites Primary Care Physician: Audree Bane Other Clinician: Referring Physician: Audree Bane Treating Physician/Extender: Rudene Re in Treatment: 4 Vascular Assessment Pulses: Posterior Tibial Dorsalis Pedis Palpable: [Left:Yes] [Right:Yes] Extremity colors, hair growth, and conditions: Extremity Color: [Left:Normal] [Right:Normal] Hair Growth on Extremity: [Left:No] [Right:No] Temperature of Extremity: [Left:Warm] [Right:Warm] Capillary Refill: [Left:< 3 seconds] [Right:< 3 seconds] Toe Nail Assessment Left: Right: Thick: Yes Yes Discolored: Yes Yes Deformed: Yes Yes Improper Length and Hygiene: No No Electronic Signature(s) Signed: 08/13/2015 5:20:05 PM By: Curtis Sites Entered By: Curtis Sites on 08/13/2015 14:37:25 [name], [name] ([id number]) -------------------------------------------------------------------------------- Multi Wound Chart Details Patient Name: [name] Date of Service: 08/13/2015 2:15 PM Medical Record Number: [id number] Patient Account Number: [id number] Date of Birth/Sex: 1914/12/23 (81 y.o. Female) Treating RN: Curtis Sites Primary Care Physician: Audree Bane Other Clinician: Referring Physician: Audree Bane Treating Physician/Extender:  Rudene Re in Treatment: 4 Vital Signs Height(in): Pulse(bpm): 89 Weight(lbs): Blood Pressure 133/71 (mmHg): Body Mass Index(BMI): Temperature(F): Respiratory Rate 16 (breaths/min): Photos: [1:No Photos] [2:No Photos] [N/A:N/A] Wound Location: [1:Right Calcaneous] [2:Left Calcaneous] [N/A:N/A] Wounding Event: [1:Pressure Injury] [2:Pressure Injury] [N/A:N/A] Primary Etiology: [1:Pressure Ulcer] [2:Pressure Ulcer] [N/A:N/A] Comorbid History: [1:Anemia, Arrhythmia, Congestive Heart Failure, Coronary Artery Disease, Deep Vein Thrombosis, Hypertension, End Stage Renal Disease, Osteoarthritis, Dementia] [2:Anemia, Arrhythmia, Congestive Heart Failure, Coronary Artery Disease,  Deep Vein Thrombosis, Hypertension, End Stage Renal Disease, Osteoarthritis, Dementia] [N/A:N/A] Date Acquired: [1:12/25/2014] [2:02/24/2015] [N/A:N/A] Weeks of Treatment: [1:4] [2:4] [N/A:N/A] Wound Status: [1:Open] [2:Open] [N/A:N/A] Measurements L x W x D 2.5x2.4x0.3 [2:2.2x3.5x0.2] [N/A:N/A] (cm) Area (cm) : [  1:4.712] [2:6.048] [N/A:N/A] Volume (cm) : [1:1.414] [2:1.21] [N/A:N/A] % Reduction in Area: [1:52.40%] [2:49.10%] [N/A:N/A] % Reduction in Volume: -42.80% [2:66.00%] [N/A:N/A] Classification: [1:Category/Stage III] [2:Unstageable/Unclassified] [N/A:N/A] Exudate Amount: [1:Medium] [2:Small] [N/A:N/A] Exudate Type: [1:Serous] [2:Serous] [N/A:N/A] Exudate Color: [1:amber] [2:amber] [N/A:N/A] Wound Margin: [1:Indistinct, nonvisible] [2:Indistinct, nonvisible] [N/A:N/A] Granulation Amount: [1:None Present (0%)] [2:None Present (0%)] [N/A:N/A] Necrotic Amount: [1:Large (67-100%)] [2:Large (67-100%)] [N/A:N/A] Necrotic Tissue: [1:N/A] [2:Eschar] [N/A:N/A] Exposed Structures: [1:Fascia: No Fat: No] [2:Fascia: No Fat: No] [N/A:N/A] Tendon: No Tendon: No Muscle: No Muscle: No Joint: No Joint: No Bone: No Bone: No Limited to Skin Limited to Skin Breakdown Breakdown Epithelialization: None  None N/A Periwound Skin Texture: Edema: No Edema: No N/A Excoriation: No Excoriation: No Induration: No Induration: No Callus: No Callus: No Crepitus: No Crepitus: No Fluctuance: No Fluctuance: No Friable: No Friable: No Rash: No Rash: No Scarring: No Scarring: No Periwound Skin Dry/Scaly: Yes Dry/Scaly: Yes N/A Moisture: Maceration: No Maceration: No Moist: No Moist: No Periwound Skin Color: Ecchymosis: Yes Ecchymosis: Yes N/A Atrophie Blanche: No Atrophie Blanche: No Cyanosis: No Cyanosis: No Erythema: No Erythema: No Hemosiderin Staining: No Hemosiderin Staining: No Mottled: No Mottled: No Pallor: No Pallor: No Rubor: No Rubor: No Temperature: No Abnormality No Abnormality N/A Tenderness on No No N/A Palpation: Wound Preparation: Ulcer Cleansing: Ulcer Cleansing: N/A Rinsed/Irrigated with Rinsed/Irrigated with Saline Saline Topical Anesthetic Topical Anesthetic Applied: Other: lidocaine Applied: Other: lidocaine 4% 4% Treatment Notes Electronic Signature(s) Signed: 08/13/2015 5:20:05 PM By: Curtis Sites Entered By: Curtis Sites on 08/13/2015 14:40:42 [name], [name] ([id number]) -------------------------------------------------------------------------------- Multi-Disciplinary Care Plan Details Patient Name: [name] Date of Service: 08/13/2015 2:15 PM Medical Record Number: [id number] Patient Account Number: [id number] Date of Birth/Sex: [birth year] (81 y.o. Female) Treating RN: Curtis Sites Primary Care Physician: Audree Bane Other Clinician: Referring Physician: Audree Bane Treating Physician/Extender: Rudene Re in Treatment: 4 Active Inactive Abuse / Safety / Falls / Self Care Management Nursing Diagnoses: Potential for falls Goals: Patient will remain injury free Date Initiated: 07/15/2015 Goal Status: Active Interventions: Assess fall risk on admission and as needed Notes: Nutrition Nursing  Diagnoses: Potential for alteratiion in Nutrition/Potential for imbalanced nutrition Goals: Patient/caregiver agrees to and verbalizes understanding of need to use nutritional supplements and/or vitamins as prescribed Date Initiated: 07/15/2015 Goal Status: Active Interventions: Assess patient nutrition upon admission and as needed per policy Notes: Orientation to the Wound Care Program Nursing Diagnoses: Knowledge deficit related to the wound healing center program Goals: Patient/caregiver will verbalize understanding of the Wound Healing Center [address] CHANTALE, LEUGERS ([id number]) Date Initiated: 07/15/2015 Goal Status: Active Interventions: Provide education on orientation to the wound center Notes: Pressure Nursing Diagnoses: Knowledge deficit related to management of pressures ulcers Goals: Patient will remain free from development of additional pressure ulcers Date Initiated: 07/15/2015 Goal Status: Active Interventions: Assess: immobility, friction, shearing, incontinence upon admission and as needed Provide education on pressure ulcers Notes: Wound/Skin Impairment Nursing Diagnoses: Impaired tissue integrity Goals: Patient/caregiver will verbalize understanding of skin care regimen Date Initiated: 07/15/2015 Goal Status: Active Interventions: Assess patient/caregiver ability to obtain necessary supplies Treatment Activities: Topical wound management initiated : 08/13/2015 Notes: Electronic Signature(s) Signed: 08/13/2015 5:20:05 PM By: Curtis Sites Entered By: Curtis Sites on 08/13/2015 14:40:32 [name], [name] ([id number]) -------------------------------------------------------------------------------- Patient/Caregiver Education Details Patient Name: [name] Date of Service: 08/13/2015 2:15 PM Medical Record Number: [id number] Patient Account Number: [id number] Date of Birth/Gender: 08/12/1915 (81 y.o. Female) Treating RN: Curtis Sites Primary Care Physician: Audree Bane Other Clinician: Referring  Physician: Audree Bane Treating Physician/Extender: Rudene Re in Treatment: 4 Education Assessment Education Provided To: Caregiver Education Topics Provided Pressure: Handouts: Other: pressure relief Methods: Explain/Verbal Responses: State content correctly Electronic Signature(s) Signed: 08/13/2015 5:20:05 PM By: Curtis Sites Entered By: Curtis Sites on 08/13/2015 14:45:38 [name], [name] ([id number]) -------------------------------------------------------------------------------- Wound Assessment Details Patient Name: [name] Date of Service: 08/13/2015 2:15 PM Medical Record Number: [id number] Patient Account Number: [id number] Date of Birth/Sex: 21-Mar-1915 (81 y.o. Female) Treating RN: Curtis Sites Primary Care Physician: Audree Bane Other Clinician: Referring Physician: Audree Bane Treating Physician/Extender: Rudene Re in Treatment: 4 Wound Status Wound Number: 1 Primary Pressure Ulcer Etiology: Wound Location: Right Calcaneous Wound Open Wounding Event: Pressure Injury Status: Date Acquired: 12/25/2014 Comorbid Anemia, Arrhythmia, Congestive Heart Weeks Of Treatment: 4 History: Failure, Coronary Artery Disease, Deep Clustered Wound: No Vein Thrombosis, Hypertension, End Stage Renal Disease, Osteoarthritis, Dementia Photos Photo Uploaded By: Curtis Sites on 08/13/2015 17:19:41 Wound Measurements Length: (cm) 2.5 % Reduction in Width: (cm) 2.4 % Reduction in Depth: (cm) 0.3 Epithelializati Area: (cm) 4.712 Tunneling: Volume: (cm) 1.414 Undermining: Area: 52.4% Volume: -42.8% on: None No No Wound Description Classification: Category/Stage III Wound Margin: Indistinct, nonvisible Exudate Amount: Medium Exudate Type: Serous Exudate Color: amber Foul Odor After Cleansing: No Wound Bed Granulation Amount: None Present (0%) Exposed  Structure Necrotic Amount: Large (67-100%) Fascia Exposed: No [name], [name] ([id number]) Fat Layer Exposed: No Tendon Exposed: No Muscle Exposed: No Joint Exposed: No Bone Exposed: No Limited to Skin Breakdown Periwound Skin Texture Texture Color No Abnormalities Noted: No No Abnormalities Noted: No Callus: No Atrophie Blanche: No Crepitus: No Cyanosis: No Excoriation: No Ecchymosis: Yes Fluctuance: No Erythema: No Friable: No Hemosiderin Staining: No Induration: No Mottled: No Localized Edema: No Pallor: No Rash: No Rubor: No Scarring: No Temperature / Pain Moisture Temperature: No Abnormality No Abnormalities Noted: No Dry / Scaly: Yes Maceration: No Moist: No Wound Preparation Ulcer Cleansing: Rinsed/Irrigated with Saline Topical Anesthetic Applied: Other: lidocaine 4%, Treatment Notes Wound #1 (Right Calcaneous) 1. Cleansed with: Clean wound with Normal Saline 2. Anesthetic Topical Lidocaine 4% cream to wound bed prior to debridement 4. Dressing Applied: Santyl Ointment 5. Secondary Dressing Applied ABD and Kerlix/Conform 7. Secured with Secretary/administrator) Signed: 08/13/2015 5:20:05 PM By: Curtis Sites Entered By: Curtis Sites on 08/13/2015 14:40:01 Kleen, [name] ([id number]) [name] ([id number]) -------------------------------------------------------------------------------- Wound Assessment Details Patient Name: [name] Date of Service: 08/13/2015 2:15 PM Medical Record Number: [id number] Patient Account Number: [id number] Date of Birth/Sex: 1915-10-06 (81 y.o. Female) Treating RN: Curtis Sites Primary Care Physician: Audree Bane Other Clinician: Referring Physician: Audree Bane Treating Physician/Extender: Rudene Re in Treatment: 4 Wound Status Wound Number: 2 Primary Pressure Ulcer Etiology: Wound Location: Left Calcaneous Wound Open Wounding Event: Pressure Injury Status: Date  Acquired: 02/24/2015 Comorbid Anemia, Arrhythmia, Congestive Heart Weeks Of Treatment: 4 History: Failure, Coronary Artery Disease, Deep Clustered Wound: No Vein Thrombosis, Hypertension, End Stage Renal Disease, Osteoarthritis, Dementia Photos Photo Uploaded By: Curtis Sites on 08/13/2015 17:19:41 Wound Measurements Length: (cm) 2.2 % Reduction in Width: (cm) 3.5 % Reduction in Depth: (cm) 0.2 Epithelializati Area: (cm) 6.048 Tunneling: Volume: (cm) 1.21 Undermining: Area: 49.1% Volume: 66% on: None No No Wound Description Classification: Unstageable/Unclassified Wound Margin: Indistinct, nonvisible Exudate Amount: Small Exudate Type: Serous Exudate Color: amber Foul Odor After Cleansing: No Wound Bed Granulation Amount: None Present (0%) Exposed Structure Necrotic Amount: Large (67-100%) Fascia Exposed: No [name], [name] ([id number]) Necrotic Quality:  Eschar Fat Layer Exposed: No Tendon Exposed: No Muscle Exposed: No Joint Exposed: No Bone Exposed: No Limited to Skin Breakdown Periwound Skin Texture Texture Color No Abnormalities Noted: No No Abnormalities Noted: No Callus: No Atrophie Blanche: No Crepitus: No Cyanosis: No Excoriation: No Ecchymosis: Yes Fluctuance: No Erythema: No Friable: No Hemosiderin Staining: No Induration: No Mottled: No Localized Edema: No Pallor: No Rash: No Rubor: No Scarring: No Temperature / Pain Moisture Temperature: No Abnormality No Abnormalities Noted: No Dry / Scaly: Yes Maceration: No Moist: No Wound Preparation Ulcer Cleansing: Rinsed/Irrigated with Saline Topical Anesthetic Applied: Other: lidocaine 4%, Treatment Notes Wound #2 (Left Calcaneous) 1. Cleansed with: Clean wound with Normal Saline 2. Anesthetic Topical Lidocaine 4% cream to wound bed prior to debridement 4. Dressing Applied: Santyl Ointment 5. Secondary Dressing Applied ABD and Kerlix/Conform 7. Secured with Music therapistTape Electronic  Signature(s) Signed: 08/13/2015 5:20:05 PM By: Curtis Sitesorthy, Joanna Entered By: Curtis Sitesorthy, Joanna on 08/13/2015 14:40:24 Krahn, Kathy [name] ([id number]) Kathy CancelASCOE, [name] ([id number]) -------------------------------------------------------------------------------- Vitals Details Patient Name: Kathy CancelASCOE, [name] Date of Service: 08/13/2015 2:15 PM Medical Record Number: [id number] Patient Account Number: [id number] Date of Birth/Sex: [birth year] (81 y.o. Female) Treating RN: Curtis Sitesorthy, Joanna Primary Care Physician: Audree BaneKING, PETER Other Clinician: Referring Physician: Audree BaneKING, PETER Treating Physician/Extender: Rudene ReBritto, Errol Weeks in Treatment: 4 Vital Signs Time Taken: 14:28 Pulse (bpm): 89 Respiratory Rate (breaths/min): 16 Blood Pressure (mmHg): 133/71 Reference Range: 80 - 120 mg / dl Electronic Signature(s) Signed: 08/13/2015 5:20:05 PM By: Curtis Sitesorthy, Joanna Entered By: Curtis Sitesorthy, Joanna on 08/13/2015 14:28:30

## 2015-08-19 ENCOUNTER — Encounter: Payer: Medicare Other | Admitting: Surgery

## 2015-08-19 DIAGNOSIS — L89610 Pressure ulcer of right heel, unstageable: Secondary | ICD-10-CM | POA: Diagnosis not present

## 2015-08-20 NOTE — Progress Notes (Signed)
XOCHIL, SHANKER ([id number]) Visit Report for 08/19/2015 Chief Complaint Document Details Patient Name: [name], [name] 08/19/2015 3:00 Date of Service: PM Medical Record [id number] Number: Patient Account Number: [id number] 15-Feb-1915 (81 y.o. Treating RN: Curtis Sites Date of Birth/Sex: Female) Other Clinician: Primary Care Physician: Audree Bane Treating Evlyn Kanner Referring Physician: Audree Bane Physician/Extender: Tania Ade in Treatment: 5 Information Obtained from: Caregiver Chief Complaint Patient is at the clinic for treatment of an open pressure ulcer. The patient has dementia and comes along with her caregiver does not have the power of attorney but is a close friend and will pressure. She says she has had changes of both heels for several months now. Electronic Signature(s) Signed: 08/19/2015 3:41:59 PM By: Evlyn Kanner MD, FACS Entered By: Evlyn Kanner on 08/19/2015 15:41:59 [name] ([id number]) -------------------------------------------------------------------------------- HPI Details Patient Name: [name] 08/19/2015 3:00 Date of Service: PM Medical Record [id number] Number: Patient Account Number: [id number] 09/13/1915 (81 y.o. Treating RN: Curtis Sites Date of Birth/Sex: Female) Other Clinician: Primary Care Physician: Audree Bane Treating Evlyn Kanner Referring Physician: Audree Bane Physician/Extender: Tania Ade in Treatment: 5 History of Present Illness Location: bilateral heel discoloration and ulceration Quality: Patient reports No Pain. Severity: Patient states wound are getting worse. Duration: Patient has had the wound for > 3 months prior to seeking treatment at the wound center Context: The wound appeared gradually over time Modifying Factors: patient is bedridden and stays in bed for most of the day Associated Signs and Symptoms: Wound has significant periowound erythema and localized edema HPI Description:  81 year old patient who has dementia and comes from [facility] nursing home where she has been noted to have bilateral heel discolorations and ulcerations for several months. The patient's son who has the power of attorney is not available and does not have much to do with his mother. Family friend Delorise Shiner has come along with the patient and she knows the patient for several years and is a Research officer, political party. I understand that the patient has been a DO NOT RESUSCITATE and no aggressive therapy was desired. 07/29/2015 -- the son is still not come to sign for her care and we do not have a power of attorney. Her friend Delorise Shiner is at the bedside. 08/13/2015 -- her son was here last week and signed the consent for surgical debridement and she is here today with her friend Delorise Shiner. Electronic Signature(s) Signed: 08/19/2015 3:42:05 PM By: Evlyn Kanner MD, FACS Entered By: Evlyn Kanner on 08/19/2015 15:42:05 [name] ([id number]) -------------------------------------------------------------------------------- Physical Exam Details Patient Name: [name] 08/19/2015 3:00 Date of Service: PM Medical Record [id number] Number: Patient Account Number: [id number] 1915/10/11 (81 y.o. Treating RN: Curtis Sites Date of Birth/Sex: Female) Other Clinician: Primary Care Physician: Audree Bane Treating Evlyn Kanner Referring Physician: Audree Bane Physician/Extender: Weeks in Treatment: 5 Constitutional . Pulse regular. Respirations normal and unlabored. Afebrile. . Eyes Nonicteric. Reactive to light. Ears, Nose, Mouth, and Throat Lips, teeth, and gums WNL.Marland Kitchen Moist mucosa without lesions . Neck supple and nontender. No palpable supraclavicular or cervical adenopathy. Normal sized without goiter. Respiratory WNL. No retractions.. Cardiovascular Pedal Pulses WNL. No clubbing, cyanosis or edema. Lymphatic No adneopathy. No adenopathy. No adenopathy. Musculoskeletal Adexa without  tenderness or enlargement.. Digits and nails w/o clubbing, cyanosis, infection, petechiae, ischemia, or inflammatory conditions.. Integumentary (Hair, Skin) No suspicious lesions. No crepitus or fluctuance. No peri-wound warmth or erythema. No masses.Marland Kitchen Psychiatric Judgement and insight Intact.. No evidence of depression, anxiety, or agitation.. Notes She continues to have eschar  and debris both heels and is rather tender today. her right foot has some edema but there is no evidence of cellulitis. Electronic Signature(s) Signed: 08/19/2015 3:42:54 PM By: Evlyn KannerBritto, Odeal Welden MD, FACS Entered By: Evlyn KannerBritto, Breona Cherubin on 08/19/2015 15:42:54 [name], Lemmie S. ([id number]) -------------------------------------------------------------------------------- Physician Orders Details Patient Name: [name], [name] 08/19/2015 3:00 Date of Service: PM Medical Record [id number] Number: Patient Account Number: [id number] 1915/02/25 (81 y.o. Treating RN: Curtis Sitesorthy, Joanna Date of Birth/Sex: Female) Other Clinician: Primary Care Physician: Audree BaneKING, PETER Treating Evlyn KannerBritto, Sequoyah Counterman Referring Physician: Audree BaneKING, PETER Physician/Extender: Tania AdeWeeks in Treatment: 5 Verbal / Phone Orders: Yes Clinician: Curtis Sitesorthy, Joanna Read Back and Verified: Yes Diagnosis Coding Wound Cleansing Wound #1 Right Calcaneous o Clean wound with Normal Saline. o Cleanse wound with mild soap and water Wound #2 Left Calcaneous o Clean wound with Normal Saline. o Cleanse wound with mild soap and water Anesthetic Wound #1 Right Calcaneous o Topical Lidocaine 4% cream applied to wound bed prior to debridement Wound #2 Left Calcaneous o Topical Lidocaine 4% cream applied to wound bed prior to debridement Primary Wound Dressing Wound #1 Right Calcaneous o Santyl Ointment Wound #2 Left Calcaneous o Santyl Ointment Secondary Dressing Wound #1 Right Calcaneous o ABD and Kerlix/Conform Wound #2 Left Calcaneous o ABD and  Kerlix/Conform Dressing Change Frequency Wound #1 Right Calcaneous o Change dressing every day. [name], [name] ([id number]) Wound #2 Left Calcaneous o Change dressing every day. Follow-up Appointments Wound #1 Right Calcaneous o Return Appointment in 1 week. Wound #2 Left Calcaneous o Return Appointment in 1 week. Off-Loading Wound #1 Right Calcaneous o Heel suspension boot to: - float heels when in the bed, no pressure on heels when sitting up Wound #2 Left Calcaneous o Heel suspension boot to: - float heels when in the bed, no pressure on heels when sitting up Additional Orders / Instructions Wound #1 Right Calcaneous o Increase protein intake. o Other: - Daily Multivitamin Wound #2 Left Calcaneous o Increase protein intake. o Other: - Daily Multivitamin Electronic Signature(s) Signed: 08/19/2015 4:31:36 PM By: Evlyn KannerBritto, Tahani Potier MD, FACS Signed: 08/19/2015 5:24:55 PM By: Curtis Sitesorthy, Joanna Entered By: Curtis Sitesorthy, Joanna on 08/19/2015 15:38:20 Krogh, Deno [name] ([id number]) -------------------------------------------------------------------------------- Problem List Details Patient Name: [name], Tennyson S. 08/19/2015 3:00 Date of Service: PM Medical Record [id number] Number: Patient Account Number: [id number] 1915/02/25 (81 y.o. Treating RN: Curtis Sitesorthy, Joanna Date of Birth/Sex: Female) Other Clinician: Primary Care Physician: Audree BaneKING, PETER Treating Evlyn KannerBritto, Tae Vonada Referring Physician: Audree BaneKING, PETER Physician/Extender: Tania AdeWeeks in Treatment: 5 Active Problems ICD-10 Encounter Code Description Active Date Diagnosis L89.610 Pressure ulcer of right heel, unstageable 07/15/2015 Yes L89.620 Pressure ulcer of left heel, unstageable 07/15/2015 Yes I70.234 Atherosclerosis of native arteries of right leg with 07/15/2015 Yes ulceration of heel and midfoot I70.244 Atherosclerosis of native arteries of left leg with ulceration 07/15/2015 Yes of heel and midfoot F03.90  Unspecified dementia without behavioral disturbance 07/15/2015 Yes Inactive Problems Resolved Problems Electronic Signature(s) Signed: 08/19/2015 3:41:51 PM By: Evlyn KannerBritto, Dabid Godown MD, FACS Entered By: Evlyn KannerBritto, Alvena Kiernan on 08/19/2015 15:41:51 Graser, Deno [name] ([id number]) -------------------------------------------------------------------------------- Progress Note Details Patient Name: [name], [name] 08/19/2015 3:00 Date of Service: PM Medical Record [id number] Number: Patient Account Number: [id number] 1915/02/25 (81 y.o. Treating RN: Curtis Sitesorthy, Joanna Date of Birth/Sex: Female) Other Clinician: Primary Care Physician: Audree BaneKING, PETER Treating Evlyn KannerBritto, Nicola Heinemann Referring Physician: Audree BaneKING, PETER Physician/Extender: Tania AdeWeeks in Treatment: 5 Subjective Chief Complaint Information obtained from Caregiver Patient is at the clinic for treatment of an open pressure ulcer. The patient has dementia and comes along with her  caregiver does not have the power of attorney but is a close friend and will pressure. She says she has had changes of both heels for several months now. History of Present Illness (HPI) The following HPI elements were documented for the patient's wound: Location: bilateral heel discoloration and ulceration Quality: Patient reports No Pain. Severity: Patient states wound are getting worse. Duration: Patient has had the wound for > 3 months prior to seeking treatment at the wound center Context: The wound appeared gradually over time Modifying Factors: patient is bedridden and stays in bed for most of the day Associated Signs and Symptoms: Wound has significant periowound erythema and localized edema 81 year old patient who has dementia and comes from [facility] nursing home where she has been noted to have bilateral heel discolorations and ulcerations for several months. The patient's son who has the power of attorney is not available and does not have much to do with his mother.  Family friend Delorise Shiner has come along with the patient and she knows the patient for several years and is a Research officer, political party. I understand that the patient has been a DO NOT RESUSCITATE and no aggressive therapy was desired. 07/29/2015 -- the son is still not come to sign for her care and we do not have a power of attorney. Her friend Delorise Shiner is at the bedside. 08/13/2015 -- her son was here last week and signed the consent for surgical debridement and she is here today with her friend Delorise Shiner. Objective Constitutional [name], [name] ([id number]) Pulse regular. Respirations normal and unlabored. Afebrile. Vitals Time Taken: 3:24 PM, Temperature: 96.6 F, Pulse: 92 bpm, Respiratory Rate: 24 breaths/min, Blood Pressure: 148/111 mmHg. Eyes Nonicteric. Reactive to light. Ears, Nose, Mouth, and Throat Lips, teeth, and gums WNL.Marland Kitchen Moist mucosa without lesions . Neck supple and nontender. No palpable supraclavicular or cervical adenopathy. Normal sized without goiter. Respiratory WNL. No retractions.. Cardiovascular Pedal Pulses WNL. No clubbing, cyanosis or edema. Lymphatic No adneopathy. No adenopathy. No adenopathy. Musculoskeletal Adexa without tenderness or enlargement.. Digits and nails w/o clubbing, cyanosis, infection, petechiae, ischemia, or inflammatory conditions.Marland Kitchen Psychiatric Judgement and insight Intact.. No evidence of depression, anxiety, or agitation.. General Notes: She continues to have eschar and debris both heels and is rather tender today. her right foot has some edema but there is no evidence of cellulitis. Integumentary (Hair, Skin) No suspicious lesions. No crepitus or fluctuance. No peri-wound warmth or erythema. No masses.. Wound #1 status is Open. Original cause of wound was Pressure Injury. The wound is located on the Right Calcaneous. The wound measures 2.5cm length x 2.8cm width x 0.3cm depth; 5.498cm^2 area and 1.649cm^3 volume. The wound is limited to skin  breakdown. There is no tunneling or undermining noted. There is a medium amount of serous drainage noted. The wound margin is indistinct and nonvisible. There is no granulation within the wound bed. There is a large (67-100%) amount of necrotic tissue within the wound bed. The periwound skin appearance exhibited: Dry/Scaly, Ecchymosis. The periwound skin appearance did not exhibit: Callus, Crepitus, Excoriation, Fluctuance, Friable, Induration, Localized Edema, Rash, Scarring, Maceration, Moist, Atrophie Blanche, Cyanosis, Hemosiderin Staining, Mottled, Pallor, Rubor, Erythema. Periwound temperature was noted as No Abnormality. Wound #2 status is Open. Original cause of wound was Pressure Injury. The wound is located on the Left Calcaneous. The wound measures 2.4cm length x 3.5cm width x 0.2cm depth; 6.597cm^2 area and 1.319cm^3 volume. The wound is limited to skin breakdown. There is no tunneling or undermining noted. [name], [name] ([id number])  There is a small amount of serous drainage noted. The wound margin is indistinct and nonvisible. There is no granulation within the wound bed. There is a large (67-100%) amount of necrotic tissue within the wound bed including Eschar. The periwound skin appearance exhibited: Dry/Scaly, Ecchymosis. The periwound skin appearance did not exhibit: Callus, Crepitus, Excoriation, Fluctuance, Friable, Induration, Localized Edema, Rash, Scarring, Maceration, Moist, Atrophie Blanche, Cyanosis, Hemosiderin Staining, Mottled, Pallor, Rubor, Erythema. Periwound temperature was noted as No Abnormality. Assessment Active Problems ICD-10 L89.610 - Pressure ulcer of right heel, unstageable L89.620 - Pressure ulcer of left heel, unstageable I70.234 - Atherosclerosis of native arteries of right leg with ulceration of heel and midfoot I70.244 - Atherosclerosis of native arteries of left leg with ulceration of heel and midfoot F03.90 - Unspecified dementia without  behavioral disturbance Plan Wound Cleansing: Wound #1 Right Calcaneous: Clean wound with Normal Saline. Cleanse wound with mild soap and water Wound #2 Left Calcaneous: Clean wound with Normal Saline. Cleanse wound with mild soap and water Anesthetic: Wound #1 Right Calcaneous: Topical Lidocaine 4% cream applied to wound bed prior to debridement Wound #2 Left Calcaneous: Topical Lidocaine 4% cream applied to wound bed prior to debridement Primary Wound Dressing: Wound #1 Right Calcaneous: Santyl Ointment Wound #2 Left Calcaneous: Santyl Ointment Secondary Dressing: Wound #1 Right Calcaneous: ABD and Kerlix/Conform Wound #2 Left Calcaneous: ABD and Kerlix/Conform [name], [name] ([id number]) Dressing Change Frequency: Wound #1 Right Calcaneous: Change dressing every day. Wound #2 Left Calcaneous: Change dressing every day. Follow-up Appointments: Wound #1 Right Calcaneous: Return Appointment in 1 week. Wound #2 Left Calcaneous: Return Appointment in 1 week. Off-Loading: Wound #1 Right Calcaneous: Heel suspension boot to: - float heels when in the bed, no pressure on heels when sitting up Wound #2 Left Calcaneous: Heel suspension boot to: - float heels when in the bed, no pressure on heels when sitting up Additional Orders / Instructions: Wound #1 Right Calcaneous: Increase protein intake. Other: - Daily Multivitamin Wound #2 Left Calcaneous: Increase protein intake. Other: - Daily Multivitamin I have recommended we continue with Santyl ointment locally, heel protectors and off loading. Nutrition and vitamin supplements have also been addressed. She will come back to see as next week. Electronic Signature(s) Signed: 08/19/2015 3:43:39 PM By: Evlyn Kanner MD, FACS Entered By: Evlyn Kanner on 08/19/2015 15:43:39 [name] ([id number]) -------------------------------------------------------------------------------- SuperBill Details Patient Name: [name] Date of Service: 08/19/2015 Medical Record Number: [id number] Patient Account Number: [id number] Date of Birth/Sex: [birth year] (81 y.o. Female) Treating RN: Curtis Sites Primary Care Physician: Audree Bane Other Clinician: Referring Physician: Audree Bane Treating Physician/Extender: Rudene Re in Treatment: 5 Diagnosis Coding ICD-10 Codes Code Description L89.610 Pressure ulcer of right heel, unstageable L89.620 Pressure ulcer of left heel, unstageable I70.234 Atherosclerosis of native arteries of right leg with ulceration of heel and midfoot I70.244 Atherosclerosis of native arteries of left leg with ulceration of heel and midfoot F03.90 Unspecified dementia without behavioral disturbance Facility Procedures CPT4 Code: 08657846 Description: 99213 - WOUND CARE VISIT-LEV 3 EST PT Modifier: Quantity: 1 Physician Procedures CPT4 Code: [id number] Description: 99213 - WC PHYS LEVEL 3 - EST PT ICD-10 Description Diagnosis L89.620 Pressure ulcer of left heel, unstageable L89.610 Pressure ulcer of right heel, unstageable Modifier: Quantity: 1 Electronic Signature(s) Signed: 08/19/2015 5:32:24 PM By: Curtis Sites Previous Signature: 08/19/2015 3:43:56 PM Version By: Evlyn Kanner MD, FACS Entered By: Curtis Sites on 08/19/2015 17:32:24

## 2015-08-20 NOTE — Progress Notes (Addendum)
Kathy CancelRASCOE, [name] ([id number]) Visit Report for 08/19/2015 Arrival Information Details Patient Name: Kathy CancelRASCOE, [name] Date of Service: 08/19/2015 3:00 PM Medical Record Number: [id number] Patient Account Number: [id number] Date of Birth/Sex: Feb 23, 1915 (81 y.o. Female) Treating RN: Curtis Sitesorthy, Joanna Primary Care Physician: Audree BaneKING, PETER Other Clinician: Referring Physician: Audree BaneKING, PETER Treating Physician/Extender: Rudene ReBritto, Errol Weeks in Treatment: 5 Visit Information History Since Last Visit Added or deleted any medications: No Patient Arrived: Wheel Chair Any new allergies or adverse No Arrival Time: 15:22 reactions: Accompanied By: friend Had a fall or experienced change in No Transfer Assistance: Manual activities of daily living that may Patient Identification Verified: Yes affect Secondary Verification Process Yes risk of falls: Completed: Signs or symptoms of abuse/neglect No Patient Has Alerts: Yes since last visito Patient Alerts: Patient on Blood Hospitalized since last visit: No Thinner Pain Present Now: Unable to ASA 81 mg Respond NO BP (R) arm Electronic Signature(s) Signed: 08/19/2015 5:24:55 PM By: Curtis Sitesorthy, Joanna Entered By: Curtis Sitesorthy, Joanna on 08/19/2015 15:22:27 Pitones, Kathy [name] ([id number]) -------------------------------------------------------------------------------- Clinic Level of Care Assessment Details Patient Name: Kathy CancelASCOE, [name] Date of Service: 08/19/2015 3:00 PM Medical Record Number: [id number] Patient Account Number: [id number] Date of Birth/Sex: Feb 23, 1915 (81 y.o. Female) Treating RN: Curtis Sitesorthy, Joanna Primary Care Physician: Audree BaneKING, PETER Other Clinician: Referring Physician: Audree BaneKING, PETER Treating Physician/Extender: Rudene ReBritto, Errol Weeks in Treatment: 5 Clinic Level of Care Assessment Items TOOL 4 Quantity Score []  - Use when only an EandM is performed on FOLLOW-UP visit 0 ASSESSMENTS - Nursing Assessment / Reassessment X -  Reassessment of Co-morbidities (includes updates in patient status) 1 10 X - Reassessment of Adherence to Treatment Plan 1 5 ASSESSMENTS - Wound and Skin Assessment / Reassessment []  - Simple Wound Assessment / Reassessment - one wound 0 X - Complex Wound Assessment / Reassessment - multiple wounds 2 5 []  - Dermatologic / Skin Assessment (not related to wound area) 0 ASSESSMENTS - Focused Assessment []  - Circumferential Edema Measurements - multi extremities 0 []  - Nutritional Assessment / Counseling / Intervention 0 X - Lower Extremity Assessment (monofilament, tuning fork, pulses) 1 5 []  - Peripheral Arterial Disease Assessment (using hand held doppler) 0 ASSESSMENTS - Ostomy and/or Continence Assessment and Care []  - Incontinence Assessment and Management 0 []  - Ostomy Care Assessment and Management (repouching, etc.) 0 PROCESS - Coordination of Care X - Simple Patient / Family Education for ongoing care 1 15 []  - Complex (extensive) Patient / Family Education for ongoing care 0 []  - Staff obtains ChiropractorConsents, Records, Test Results / Process Orders 0 []  - Staff telephones HHA, Nursing Homes / Clarify orders / etc 0 []  - Routine Transfer to another Facility (non-emergent condition) 0 [name], [name] ([id number]) []  - Routine Hospital Admission (non-emergent condition) 0 []  - New Admissions / Manufacturing engine[employer]surance Authorizations / Ordering NPWT, Apligraf, etc. 0 []  - Emergency Hospital Admission (emergent condition) 0 X - Simple Discharge Coordination 1 10 []  - Complex (extensive) Discharge Coordination 0 PROCESS - Special Needs []  - Pediatric / Minor Patient Management 0 []  - Isolation Patient Management 0 []  - Hearing / Language / Visual special needs 0 []  - Assessment of Community assistance (transportation, D/C planning, etc.) 0 []  - Additional assistance / Altered mentation 0 []  - Support Surface(s) Assessment (bed, cushion, seat, etc.) 0 INTERVENTIONS - Wound Cleansing / Measurement []  -  Simple Wound Cleansing - one wound 0 X - Complex Wound Cleansing - multiple wounds 2 5 X - Wound Imaging (photographs - any number of wounds)  1 5 []  - Wound Tracing (instead of photographs) 0 []  - Simple Wound Measurement - one wound 0 X - Complex Wound Measurement - multiple wounds 2 5 INTERVENTIONS - Wound Dressings X - Small Wound Dressing one or multiple wounds 2 10 []  - Medium Wound Dressing one or multiple wounds 0 []  - Large Wound Dressing one or multiple wounds 0 X - Application of Medications - topical 1 5 []  - Application of Medications - injection 0 INTERVENTIONS - Miscellaneous []  - External ear exam 0 [name], [name] ([id number]) []  - Specimen Collection (cultures, biopsies, blood, body fluids, etc.) 0 []  - Specimen(s) / Culture(s) sent or taken to Lab for analysis 0 []  - Patient Transfer (multiple staff / Michiel Sites Lift / Similar devices) 0 []  - Simple Staple / Suture removal (25 or less) 0 []  - Complex Staple / Suture removal (26 or more) 0 []  - Hypo / Hyperglycemic Management (close monitor of Blood Glucose) 0 []  - Ankle / Brachial Index (ABI) - do not check if billed separately 0 X - Vital Signs 1 5 Has the patient been seen at the hospital within the last three years: Yes Total Score: 110 Level Of Care: New/Established - Level 3 Electronic Signature(s) Signed: 08/19/2015 5:32:12 PM By: Curtis Sites Entered By: Curtis Sites on 08/19/2015 17:32:12 [name], [name] ([id number]) -------------------------------------------------------------------------------- Encounter Discharge Information Details Patient Name: [name] Date of Service: 08/19/2015 3:00 PM Medical Record Number: [id number] Patient Account Number: [id number] Date of Birth/Sex: [birth year] (81 y.o. Female) Treating RN: Curtis Sites Primary Care Physician: Audree Bane Other Clinician: Referring Physician: Audree Bane Treating Physician/Extender: Rudene Re in Treatment:  5 Encounter Discharge Information Items Discharge Pain Level: Insensate Discharge Condition: Stable Ambulatory Status: Wheelchair Discharge Destination: Nursing Home Transportation: Private Auto Accompanied By: friend Schedule Follow-up Appointment: Yes Medication Reconciliation completed and provided to Patient/Care No Lacinda Curvin: Provided on Clinical Summary of Care: 08/19/2015 Form Type Recipient Paper Patient DR Electronic Signature(s) Signed: 08/19/2015 5:07:24 PM By: Curtis Sites Previous Signature: 08/19/2015 3:54:08 PM Version By: Gwenlyn Perking Entered By: Curtis Sites on 08/19/2015 17:07:24 Digioia, [name] ([id number]) -------------------------------------------------------------------------------- Lower Extremity Assessment Details Patient Name: [name] Date of Service: 08/19/2015 3:00 PM Medical Record Number: [id number] Patient Account Number: [id number] Date of Birth/Sex: [birth year] (81 y.o. Female) Treating RN: Curtis Sites Primary Care Physician: Audree Bane Other Clinician: Referring Physician: Audree Bane Treating Physician/Extender: Rudene Re in Treatment: 5 Edema Assessment Assessed: [Left: No] [Right: No] Edema: [Left: Ye] [Right: s] Vascular Assessment Pulses: Posterior Tibial Dorsalis Pedis Palpable: [Left:Yes] [Right:Yes] Extremity colors, hair growth, and conditions: Extremity Color: [Left:Normal] [Right:Normal] Hair Growth on Extremity: [Left:No] [Right:No] Temperature of Extremity: [Left:Warm] [Right:Warm] Capillary Refill: [Left:< 3 seconds] [Right:< 3 seconds] Toe Nail Assessment Left: Right: Thick: Yes Yes Discolored: Yes Yes Deformed: Yes Yes Improper Length and Hygiene: Yes Yes Electronic Signature(s) Signed: 08/19/2015 5:24:55 PM By: Curtis Sites Entered By: Curtis Sites on 08/19/2015 15:29:34 Badal, [name]  ([id number]) -------------------------------------------------------------------------------- Multi Wound Chart Details Patient Name: [name] Date of Service: 08/19/2015 3:00 PM Medical Record Number: [id number] Patient Account Number: [id number] Date of Birth/Sex: May 04, 1915 (81 y.o. Female) Treating RN: Curtis Sites Primary Care Physician: Audree Bane Other Clinician: Referring Physician: Audree Bane Treating Physician/Extender: Rudene Re in Treatment: 5 Vital Signs Height(in): Pulse(bpm): 92 Weight(lbs): Blood Pressure 148/111 (mmHg): Body Mass Index(BMI): Temperature(F): 96.6 Respiratory Rate 24 (breaths/min): Photos: [1:No Photos] [2:No Photos] [N/A:N/A] Wound Location: [1:Right Calcaneous] [2:Left Calcaneous] [N/A:N/A] Wounding Event: [1:Pressure Injury] [  2:Pressure Injury] [N/A:N/A] Primary Etiology: [1:Pressure Ulcer] [2:Pressure Ulcer] [N/A:N/A] Comorbid History: [1:Anemia, Arrhythmia, Congestive Heart Failure, Coronary Artery Disease, Deep Vein Thrombosis, Hypertension, End Stage Renal Disease, Osteoarthritis, Dementia] [2:Anemia, Arrhythmia, Congestive Heart Failure, Coronary Artery Disease,  Deep Vein Thrombosis, Hypertension, End Stage Renal Disease, Osteoarthritis, Dementia] [N/A:N/A] Date Acquired: [1:12/25/2014] [2:02/24/2015] [N/A:N/A] Weeks of Treatment: [1:5] [2:5] [N/A:N/A] Wound Status: [1:Open] [2:Open] [N/A:N/A] Measurements L x W x D 2.5x2.8x0.3 [2:2.4x3.5x0.2] [N/A:N/A] (cm) Area (cm) : [1:5.498] [2:6.597] [N/A:N/A] Volume (cm) : [1:1.649] [2:1.319] [N/A:N/A] % Reduction in Area: [1:44.40%] [2:44.40%] [N/A:N/A] % Reduction in Volume: -66.60% [2:63.00%] [N/A:N/A] Classification: [1:Category/Stage III] [2:Unstageable/Unclassified] [N/A:N/A] Exudate Amount: [1:Medium] [2:Small] [N/A:N/A] Exudate Type: [1:Serous] [2:Serous] [N/A:N/A] Exudate Color: [1:amber] [2:amber] [N/A:N/A] Wound Margin: [1:Indistinct, nonvisible]  [2:Indistinct, nonvisible] [N/A:N/A] Granulation Amount: [1:None Present (0%)] [2:None Present (0%)] [N/A:N/A] Necrotic Amount: [1:Large (67-100%)] [2:Large (67-100%)] [N/A:N/A] Necrotic Tissue: [1:N/A] [2:Eschar] [N/A:N/A] Exposed Structures: [1:Fascia: No Fat: No] [2:Fascia: No Fat: No] [N/A:N/A] Tendon: No Tendon: No Muscle: No Muscle: No Joint: No Joint: No Bone: No Bone: No Limited to Skin Limited to Skin Breakdown Breakdown Epithelialization: None None N/A Periwound Skin Texture: Edema: No Edema: No N/A Excoriation: No Excoriation: No Induration: No Induration: No Callus: No Callus: No Crepitus: No Crepitus: No Fluctuance: No Fluctuance: No Friable: No Friable: No Rash: No Rash: No Scarring: No Scarring: No Periwound Skin Dry/Scaly: Yes Dry/Scaly: Yes N/A Moisture: Maceration: No Maceration: No Moist: No Moist: No Periwound Skin Color: Ecchymosis: Yes Ecchymosis: Yes N/A Atrophie Blanche: No Atrophie Blanche: No Cyanosis: No Cyanosis: No Erythema: No Erythema: No Hemosiderin Staining: No Hemosiderin Staining: No Mottled: No Mottled: No Pallor: No Pallor: No Rubor: No Rubor: No Temperature: No Abnormality No Abnormality N/A Tenderness on No No N/A Palpation: Wound Preparation: Ulcer Cleansing: Ulcer Cleansing: N/A Rinsed/Irrigated with Rinsed/Irrigated with Saline Saline Topical Anesthetic Topical Anesthetic Applied: Other: lidocaine Applied: Other: lidocaine 4% 4% Treatment Notes Electronic Signature(s) Signed: 08/19/2015 5:24:55 PM By: Curtis Sites Entered By: Curtis Sites on 08/19/2015 15:29:48 Hodder, [name] ([id number]) -------------------------------------------------------------------------------- Multi-Disciplinary Care Plan Details Patient Name: [name] Date of Service: 08/19/2015 3:00 PM Medical Record Number: [id number] Patient Account Number: [id number] Date of Birth/Sex: [birth year] (81 y.o.  Female) Treating RN: Curtis Sites Primary Care Physician: Audree Bane Other Clinician: Referring Physician: Audree Bane Treating Physician/Extender: Rudene Re in Treatment: 5 Active Inactive Electronic Signature(s) Signed: 09/02/2015 4:46:58 PM By: Curtis Sites Signed:  4:10:23 PM By: Elliot Gurney RN, BSN, Kim RN, BSN Previous Signature: 08/19/2015 5:24:55 PM Version By: Curtis Sites Entered By: Elliot Gurney RN, BSN, Kim on 09/02/2015 15:25:58 [place], [name] ([id number]) -------------------------------------------------------------------------------- Patient/Caregiver Education Details Patient Name: [name] Date of Service: 08/19/2015 3:00 PM Medical Record Number: [id number] Patient Account Number: [id number] Date of Birth/Gender: April 13, 1915 (81 y.o. Female) Treating RN: Curtis Sites Primary Care Physician: Audree Bane Other Clinician: Referring Physician: Audree Bane Treating Physician/Extender: Rudene Re in Treatment: 5 Education Assessment Education Provided To: Caregiver Education Topics Provided Offloading: Handouts: Other: offloading heels Methods: Demonstration, Explain/Verbal Responses: State content correctly Electronic Signature(s) Signed: 08/19/2015 5:06:59 PM By: Curtis Sites Entered By: Curtis Sites on 08/19/2015 17:06:59 [place], [name] ([id number]) -------------------------------------------------------------------------------- Wound Assessment Details Patient Name: [name] Date of Service: 08/19/2015 3:00 PM Medical Record Number: [id number] Patient Account Number: [id number] Date of Birth/Sex: 09-21-1915 (81 y.o. Female) Treating RN: Curtis Sites Primary Care Physician: Audree Bane Other Clinician: Referring Physician: Audree Bane Treating Physician/Extender: Rudene Re in Treatment: 5 Wound Status Wound Number: 1 Primary Pressure Ulcer Etiology:  Wound Location: Right  Calcaneous Wound Open Wounding Event: Pressure Injury Status: Date Acquired: 12/25/2014 Comorbid Anemia, Arrhythmia, Congestive Heart Weeks Of Treatment: 5 History: Failure, Coronary Artery Disease, Deep Clustered Wound: No Vein Thrombosis, Hypertension, End Stage Renal Disease, Osteoarthritis, Dementia Photos Photo Uploaded By: Elliot Gurney, RN, BSN, Kim on 08/19/2015 17:38:11 Wound Measurements Length: (cm) 2.5 % Reduction in Width: (cm) 2.8 % Reduction in Depth: (cm) 0.3 Epithelializati Area: (cm) 5.498 Tunneling: Volume: (cm) 1.649 Undermining: Area: 44.4% Volume: -66.6% on: None No No Wound Description Classification: Category/Stage III Wound Margin: Indistinct, nonvisible Exudate Amount: Medium Exudate Type: Serous Exudate Color: amber Foul Odor After Cleansing: No Wound Bed Granulation Amount: None Present (0%) Exposed Structure Necrotic Amount: Large (67-100%) Fascia Exposed: No Ney, [name] ([id number]) Fat Layer Exposed: No Tendon Exposed: No Muscle Exposed: No Joint Exposed: No Bone Exposed: No Limited to Skin Breakdown Periwound Skin Texture Texture Color No Abnormalities Noted: No No Abnormalities Noted: No Callus: No Atrophie Blanche: No Crepitus: No Cyanosis: No Excoriation: No Ecchymosis: Yes Fluctuance: No Erythema: No Friable: No Hemosiderin Staining: No Induration: No Mottled: No Localized Edema: No Pallor: No Rash: No Rubor: No Scarring: No Temperature / Pain Moisture Temperature: No Abnormality No Abnormalities Noted: No Dry / Scaly: Yes Maceration: No Moist: No Wound Preparation Ulcer Cleansing: Rinsed/Irrigated with Saline Topical Anesthetic Applied: Other: lidocaine 4%, Electronic Signature(s) Signed: 08/19/2015 5:24:55 PM By: Curtis Sites Entered By: Curtis Sites on 08/19/2015 15:28:04 [name] ([id number]) -------------------------------------------------------------------------------- Wound  Assessment Details Patient Name: [name] Date of Service: 08/19/2015 3:00 PM Medical Record Number: [id number] Patient Account Number: [id number] Date of Birth/Sex: 1915/03/23 (81 y.o. Female) Treating RN: Curtis Sites Primary Care Physician: Audree Bane Other Clinician: Referring Physician: Audree Bane Treating Physician/Extender: Rudene Re in Treatment: 5 Wound Status Wound Number: 2 Primary Pressure Ulcer Etiology: Wound Location: Left Calcaneous Wound Open Wounding Event: Pressure Injury Status: Date Acquired: 02/24/2015 Comorbid Anemia, Arrhythmia, Congestive Heart Weeks Of Treatment: 5 History: Failure, Coronary Artery Disease, Deep Clustered Wound: No Vein Thrombosis, Hypertension, End Stage Renal Disease, Osteoarthritis, Dementia Photos Photo Uploaded By: Elliot Gurney, RN, BSN, Kim on 08/19/2015 17:39:28 Wound Measurements Length: (cm) 2.4 % Reduction in Width: (cm) 3.5 % Reduction in Depth: (cm) 0.2 Epithelializati Area: (cm) 6.597 Tunneling: Volume: (cm) 1.319 Undermining: Area: 44.4% Volume: 63% on: None No No Wound Description Classification: Unstageable/Unclassified Wound Margin: Indistinct, nonvisible Exudate Amount: Small Exudate Type: Serous Exudate Color: amber Foul Odor After Cleansing: No Wound Bed Granulation Amount: None Present (0%) Exposed Structure Necrotic Amount: Large (67-100%) Fascia Exposed: No Bohlin, [name] ([id number]) Necrotic Quality: Eschar Fat Layer Exposed: No Tendon Exposed: No Muscle Exposed: No Joint Exposed: No Bone Exposed: No Limited to Skin Breakdown Periwound Skin Texture Texture Color No Abnormalities Noted: No No Abnormalities Noted: No Callus: No Atrophie Blanche: No Crepitus: No Cyanosis: No Excoriation: No Ecchymosis: Yes Fluctuance: No Erythema: No Friable: No Hemosiderin Staining: No Induration: No Mottled: No Localized Edema: No Pallor: No Rash: No Rubor:  No Scarring: No Temperature / Pain Moisture Temperature: No Abnormality No Abnormalities Noted: No Dry / Scaly: Yes Maceration: No Moist: No Wound Preparation Ulcer Cleansing: Rinsed/Irrigated with Saline Topical Anesthetic Applied: Other: lidocaine 4%, Electronic Signature(s) Signed: 08/19/2015 5:24:55 PM By: Curtis Sites Entered By: Curtis Sites on 08/19/2015 15:28:15 Fogelman, [name] ([id number]) -------------------------------------------------------------------------------- Vitals Details Patient Name: [name] Date of Service: 08/19/2015 3:00 PM Medical Record Number: [id number] Patient Account Number: [id number] Date of Birth/Sex: [birth year] (81 y.o. Female) Treating RN:  Curtis Sites Primary Care Physician: Audree Bane Other Clinician: Referring Physician: Audree Bane Treating Physician/Extender: Rudene Re in Treatment: 5 Vital Signs Time Taken: 15:24 Temperature (F): 96.6 Pulse (bpm): 92 Respiratory Rate (breaths/min): 24 Blood Pressure (mmHg): 148/111 Reference Range: 80 - 120 mg / dl Electronic Signature(s) Signed: 08/19/2015 5:24:55 PM By: Curtis Sites Entered By: Curtis Sites on 08/19/2015 15:25:44

## 2015-08-26 ENCOUNTER — Ambulatory Visit: Payer: Medicare Other | Admitting: Surgery

## 2015-09-02 ENCOUNTER — Ambulatory Visit: Payer: Medicare Other | Admitting: Surgery

## 2015-09-26 DEATH — deceased
